# Patient Record
Sex: Female | Born: 1965 | Race: White | Hispanic: No | Marital: Married | Smoking: Former smoker
Health system: Southern US, Community
[De-identification: ages and names within clinical notes are randomized; demographics above are authoritative.]

## PROBLEM LIST (undated history)

## (undated) DIAGNOSIS — D709 Neutropenia, unspecified: Secondary | ICD-10-CM

## (undated) DIAGNOSIS — R0600 Dyspnea, unspecified: Secondary | ICD-10-CM

## (undated) DIAGNOSIS — D72819 Decreased white blood cell count, unspecified: Secondary | ICD-10-CM

## (undated) HISTORY — PX: NO PAST SURGERIES: SHX2092

---

## 2003-06-17 ENCOUNTER — Other Ambulatory Visit: Admission: RE | Admit: 2003-06-17 | Discharge: 2003-06-17 | Payer: Self-pay | Admitting: Obstetrics and Gynecology

## 2011-08-11 ENCOUNTER — Other Ambulatory Visit: Payer: Self-pay | Admitting: Family Medicine

## 2011-08-11 DIAGNOSIS — M545 Low back pain, unspecified: Secondary | ICD-10-CM

## 2011-08-13 ENCOUNTER — Ambulatory Visit
Admission: RE | Admit: 2011-08-13 | Discharge: 2011-08-13 | Disposition: A | Discharge status: Home / Self Care | Payer: 59 | Source: Ambulatory Visit | Attending: Family Medicine | Admitting: Family Medicine

## 2011-08-13 DIAGNOSIS — M545 Low back pain, unspecified: Secondary | ICD-10-CM

## 2011-08-14 ENCOUNTER — Other Ambulatory Visit: Payer: Self-pay

## 2011-08-15 ENCOUNTER — Other Ambulatory Visit: Payer: Self-pay | Admitting: Family Medicine

## 2011-08-15 DIAGNOSIS — R209 Unspecified disturbances of skin sensation: Secondary | ICD-10-CM

## 2011-08-16 ENCOUNTER — Ambulatory Visit
Admission: RE | Admit: 2011-08-16 | Discharge: 2011-08-16 | Disposition: A | Discharge status: Home / Self Care | Payer: 59 | Source: Ambulatory Visit | Attending: Family Medicine | Admitting: Family Medicine

## 2011-08-16 DIAGNOSIS — R209 Unspecified disturbances of skin sensation: Secondary | ICD-10-CM

## 2012-05-08 ENCOUNTER — Other Ambulatory Visit (HOSPITAL_COMMUNITY)
Admission: RE | Admit: 2012-05-08 | Discharge: 2012-05-08 | Disposition: A | Discharge status: Home / Self Care | Payer: 59 | Source: Ambulatory Visit | Attending: Family Medicine | Admitting: Family Medicine

## 2012-05-08 ENCOUNTER — Other Ambulatory Visit: Payer: Self-pay | Admitting: Family Medicine

## 2012-05-08 DIAGNOSIS — Z1151 Encounter for screening for human papillomavirus (HPV): Secondary | ICD-10-CM | POA: Insufficient documentation

## 2012-05-08 DIAGNOSIS — Z124 Encounter for screening for malignant neoplasm of cervix: Secondary | ICD-10-CM | POA: Insufficient documentation

## 2012-10-16 ENCOUNTER — Other Ambulatory Visit: Payer: Self-pay | Admitting: Family Medicine

## 2012-10-16 DIAGNOSIS — M549 Dorsalgia, unspecified: Secondary | ICD-10-CM

## 2012-11-28 ENCOUNTER — Other Ambulatory Visit: Payer: Self-pay

## 2012-11-28 DIAGNOSIS — Z1231 Encounter for screening mammogram for malignant neoplasm of breast: Secondary | ICD-10-CM

## 2012-12-09 ENCOUNTER — Ambulatory Visit
Admission: RE | Admit: 2012-12-09 | Discharge: 2012-12-09 | Disposition: A | Discharge status: Home / Self Care | Payer: 59 | Source: Ambulatory Visit

## 2012-12-09 DIAGNOSIS — Z1231 Encounter for screening mammogram for malignant neoplasm of breast: Secondary | ICD-10-CM

## 2015-02-17 ENCOUNTER — Other Ambulatory Visit: Payer: Self-pay

## 2015-02-17 DIAGNOSIS — Z1231 Encounter for screening mammogram for malignant neoplasm of breast: Secondary | ICD-10-CM

## 2015-02-19 ENCOUNTER — Ambulatory Visit: Payer: Self-pay

## 2015-03-18 ENCOUNTER — Ambulatory Visit
Admission: RE | Admit: 2015-03-18 | Discharge: 2015-03-18 | Disposition: A | Discharge status: Home / Self Care | Payer: 59 | Source: Ambulatory Visit

## 2015-03-18 DIAGNOSIS — Z1231 Encounter for screening mammogram for malignant neoplasm of breast: Secondary | ICD-10-CM

## 2016-01-25 DIAGNOSIS — M5136 Other intervertebral disc degeneration, lumbar region: Secondary | ICD-10-CM | POA: Diagnosis not present

## 2016-01-25 DIAGNOSIS — M5126 Other intervertebral disc displacement, lumbar region: Secondary | ICD-10-CM | POA: Diagnosis not present

## 2016-01-25 DIAGNOSIS — M5410 Radiculopathy, site unspecified: Secondary | ICD-10-CM | POA: Diagnosis not present

## 2016-05-29 DIAGNOSIS — L718 Other rosacea: Secondary | ICD-10-CM | POA: Diagnosis not present

## 2016-05-29 DIAGNOSIS — C44519 Basal cell carcinoma of skin of other part of trunk: Secondary | ICD-10-CM | POA: Diagnosis not present

## 2016-05-29 DIAGNOSIS — L218 Other seborrheic dermatitis: Secondary | ICD-10-CM | POA: Diagnosis not present

## 2016-06-26 DIAGNOSIS — Z08 Encounter for follow-up examination after completed treatment for malignant neoplasm: Secondary | ICD-10-CM | POA: Diagnosis not present

## 2016-06-26 DIAGNOSIS — Z85828 Personal history of other malignant neoplasm of skin: Secondary | ICD-10-CM | POA: Diagnosis not present

## 2016-10-27 DIAGNOSIS — Z23 Encounter for immunization: Secondary | ICD-10-CM | POA: Diagnosis not present

## 2016-11-13 ENCOUNTER — Other Ambulatory Visit: Payer: Self-pay | Admitting: Family Medicine

## 2016-11-13 DIAGNOSIS — J309 Allergic rhinitis, unspecified: Secondary | ICD-10-CM | POA: Diagnosis not present

## 2016-11-13 DIAGNOSIS — Z0000 Encounter for general adult medical examination without abnormal findings: Secondary | ICD-10-CM | POA: Diagnosis not present

## 2016-11-13 DIAGNOSIS — G43109 Migraine with aura, not intractable, without status migrainosus: Secondary | ICD-10-CM | POA: Diagnosis not present

## 2016-11-13 DIAGNOSIS — Z1231 Encounter for screening mammogram for malignant neoplasm of breast: Secondary | ICD-10-CM

## 2016-11-13 DIAGNOSIS — R238 Other skin changes: Secondary | ICD-10-CM | POA: Diagnosis not present

## 2016-11-30 ENCOUNTER — Ambulatory Visit
Admission: RE | Admit: 2016-11-30 | Discharge: 2016-11-30 | Disposition: A | Discharge status: Home / Self Care | Payer: 59 | Source: Ambulatory Visit | Attending: Family Medicine | Admitting: Family Medicine

## 2016-11-30 DIAGNOSIS — Z1231 Encounter for screening mammogram for malignant neoplasm of breast: Secondary | ICD-10-CM | POA: Diagnosis not present

## 2016-12-28 DIAGNOSIS — M3500 Sicca syndrome, unspecified: Secondary | ICD-10-CM | POA: Diagnosis not present

## 2016-12-28 DIAGNOSIS — M47816 Spondylosis without myelopathy or radiculopathy, lumbar region: Secondary | ICD-10-CM | POA: Diagnosis not present

## 2017-01-24 DIAGNOSIS — R062 Wheezing: Secondary | ICD-10-CM | POA: Diagnosis not present

## 2017-01-24 DIAGNOSIS — J0100 Acute maxillary sinusitis, unspecified: Secondary | ICD-10-CM | POA: Diagnosis not present

## 2017-01-24 DIAGNOSIS — R05 Cough: Secondary | ICD-10-CM | POA: Diagnosis not present

## 2017-06-19 DIAGNOSIS — J0140 Acute pansinusitis, unspecified: Secondary | ICD-10-CM | POA: Diagnosis not present

## 2017-06-19 DIAGNOSIS — R509 Fever, unspecified: Secondary | ICD-10-CM | POA: Diagnosis not present

## 2017-06-19 DIAGNOSIS — R52 Pain, unspecified: Secondary | ICD-10-CM | POA: Diagnosis not present

## 2017-11-09 DIAGNOSIS — Z23 Encounter for immunization: Secondary | ICD-10-CM | POA: Diagnosis not present

## 2017-11-26 ENCOUNTER — Other Ambulatory Visit: Payer: Self-pay | Admitting: Family Medicine

## 2017-11-26 ENCOUNTER — Other Ambulatory Visit (HOSPITAL_COMMUNITY)
Admission: RE | Admit: 2017-11-26 | Discharge: 2017-11-26 | Disposition: A | Discharge status: Home / Self Care | Payer: 59 | Source: Ambulatory Visit | Attending: Family Medicine | Admitting: Family Medicine

## 2017-11-26 DIAGNOSIS — Z124 Encounter for screening for malignant neoplasm of cervix: Secondary | ICD-10-CM | POA: Diagnosis present

## 2017-11-26 DIAGNOSIS — G43109 Migraine with aura, not intractable, without status migrainosus: Secondary | ICD-10-CM | POA: Diagnosis not present

## 2017-11-26 DIAGNOSIS — R300 Dysuria: Secondary | ICD-10-CM | POA: Diagnosis not present

## 2017-11-26 DIAGNOSIS — Z0000 Encounter for general adult medical examination without abnormal findings: Secondary | ICD-10-CM | POA: Diagnosis not present

## 2017-11-26 DIAGNOSIS — J309 Allergic rhinitis, unspecified: Secondary | ICD-10-CM | POA: Diagnosis not present

## 2017-11-26 DIAGNOSIS — Z13220 Encounter for screening for lipoid disorders: Secondary | ICD-10-CM | POA: Diagnosis not present

## 2017-11-27 ENCOUNTER — Other Ambulatory Visit: Payer: Self-pay | Admitting: Family Medicine

## 2017-11-27 DIAGNOSIS — Z780 Asymptomatic menopausal state: Secondary | ICD-10-CM

## 2017-11-27 DIAGNOSIS — E2839 Other primary ovarian failure: Secondary | ICD-10-CM

## 2017-11-28 LAB — CYTOLOGY - PAP
DIAGNOSIS: NEGATIVE
HPV: NOT DETECTED

## 2017-12-07 ENCOUNTER — Other Ambulatory Visit: Payer: Self-pay | Admitting: Family Medicine

## 2017-12-07 DIAGNOSIS — Z1231 Encounter for screening mammogram for malignant neoplasm of breast: Secondary | ICD-10-CM

## 2018-01-22 ENCOUNTER — Other Ambulatory Visit: Payer: 59

## 2018-01-22 ENCOUNTER — Ambulatory Visit: Payer: 59

## 2018-02-12 ENCOUNTER — Ambulatory Visit
Admission: RE | Admit: 2018-02-12 | Discharge: 2018-02-12 | Disposition: A | Discharge status: Home / Self Care | Payer: 59 | Source: Ambulatory Visit | Attending: Family Medicine | Admitting: Family Medicine

## 2018-02-12 DIAGNOSIS — Z13820 Encounter for screening for osteoporosis: Secondary | ICD-10-CM | POA: Diagnosis not present

## 2018-02-12 DIAGNOSIS — Z1231 Encounter for screening mammogram for malignant neoplasm of breast: Secondary | ICD-10-CM | POA: Diagnosis not present

## 2018-02-12 DIAGNOSIS — Z780 Asymptomatic menopausal state: Secondary | ICD-10-CM

## 2018-02-12 DIAGNOSIS — E2839 Other primary ovarian failure: Secondary | ICD-10-CM

## 2018-06-28 IMAGING — MG 2D DIGITAL SCREENING BILATERAL MAMMOGRAM WITH CAD AND ADJUNCT TO
8 of 12 series · 8 of 28 positions shown · non-contrast
Comparison: Previous exam(s).

CLINICAL DATA: Screening.

EXAM:
2D DIGITAL SCREENING BILATERAL MAMMOGRAM WITH CAD AND ADJUNCT TOMO

[L CC synth-2D]
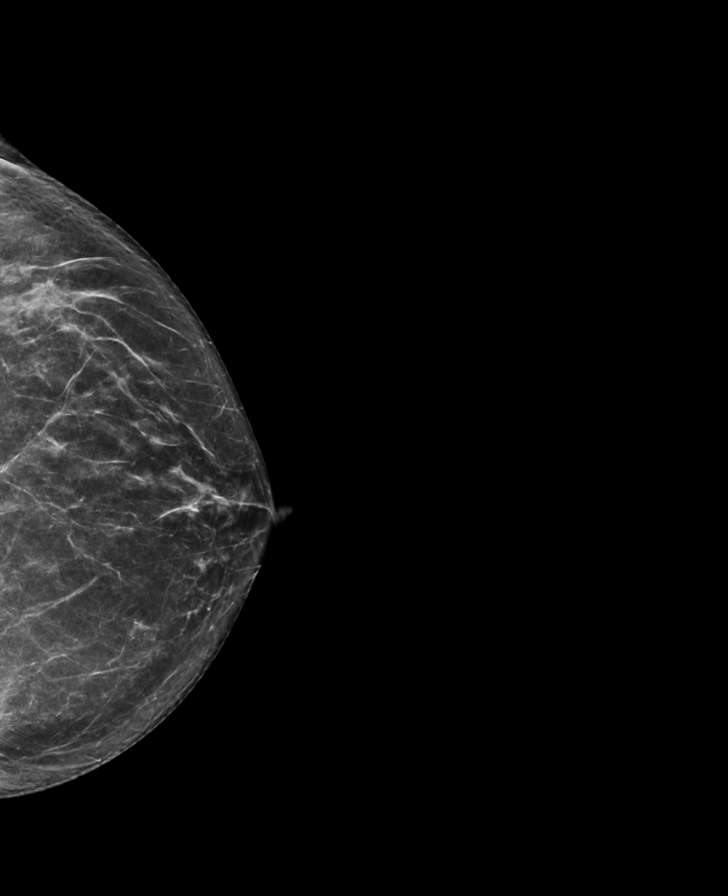

[R MLO synth-2D]
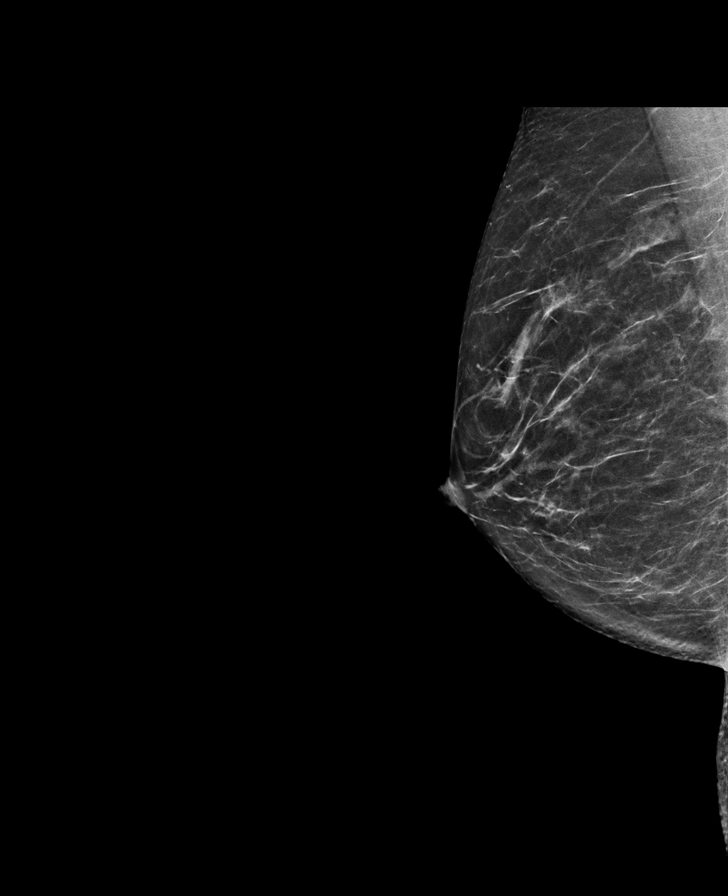

[R CC synth-2D]
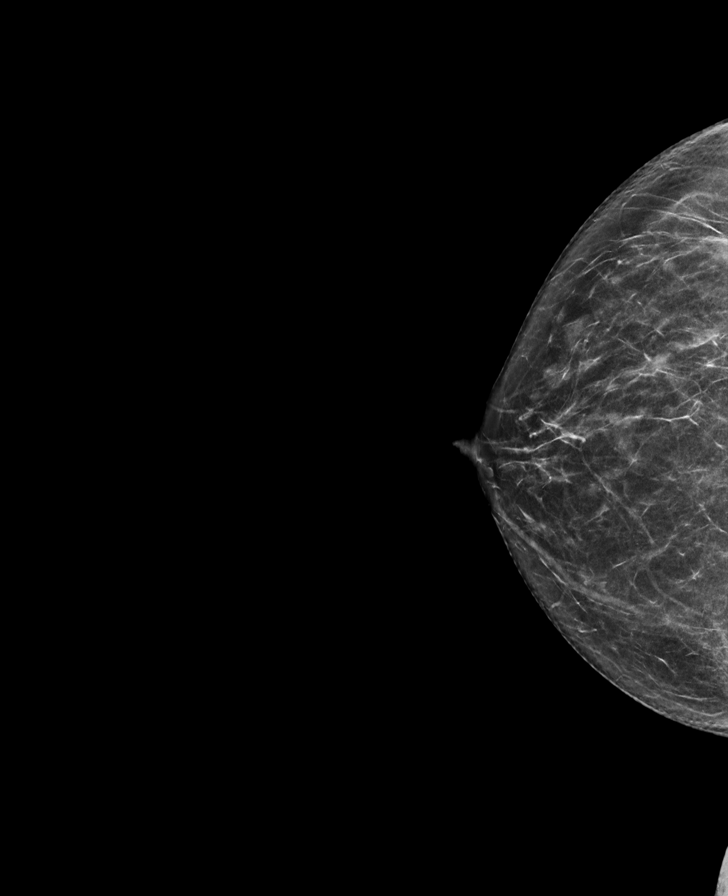

[L CC]
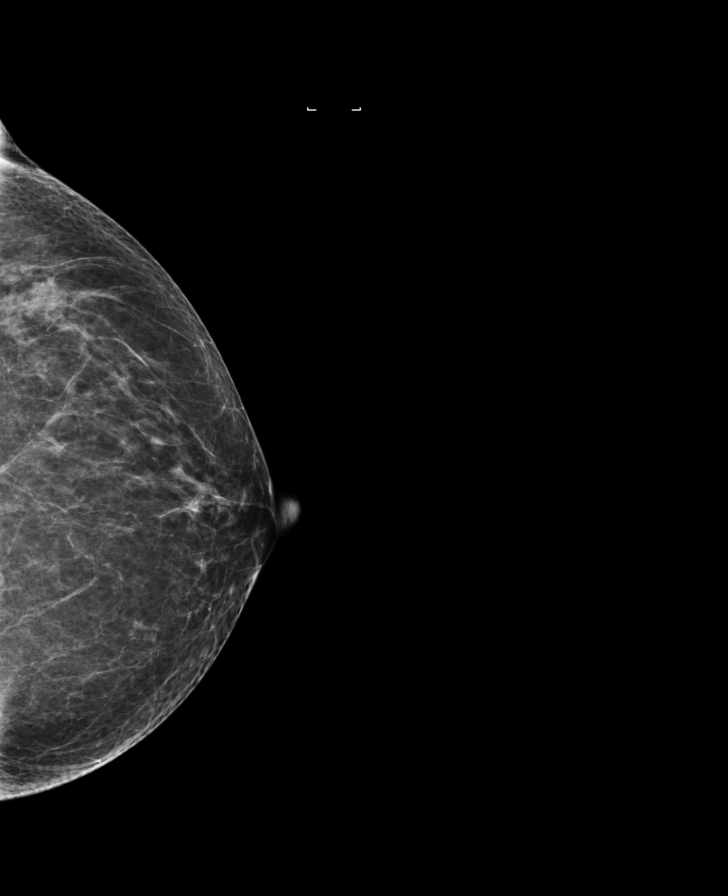

[L MLO]
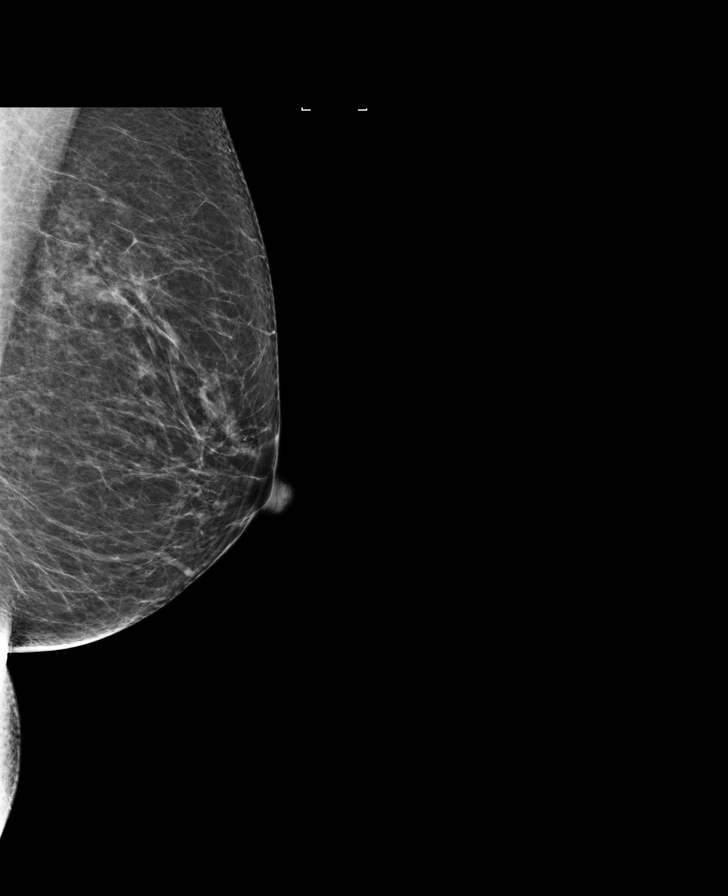

[R CC]
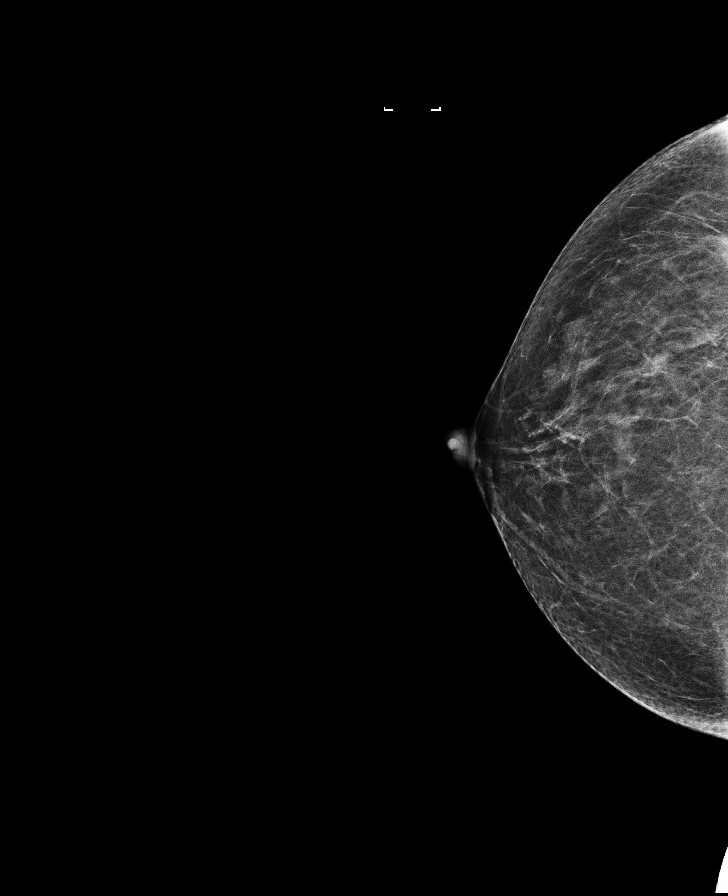

[L MLO synth-2D]
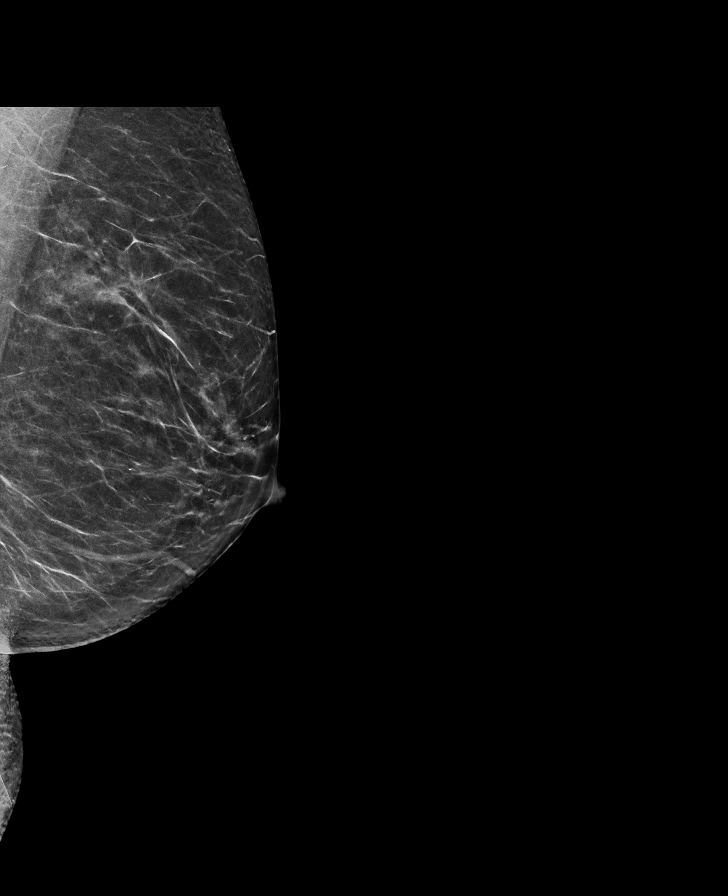

[R MLO]
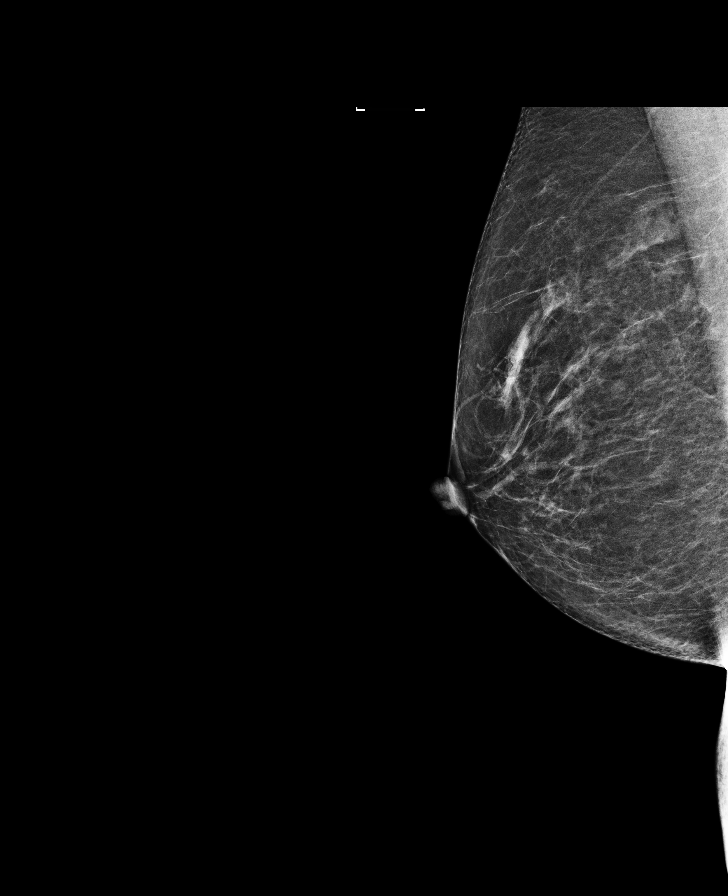

[8 of 28 positions shown; findings below may reference images not displayed]

ACR Breast Density Category b: There are scattered areas of
fibroglandular density.
FINDINGS: There are no findings suspicious for malignancy. Images were
processed with CAD.
IMPRESSION: No mammographic evidence of malignancy. A result letter of this
screening mammogram will be mailed directly to the patient.

RECOMMENDATION:
Screening mammogram in one year. (Code:97-6-RS4)

BI-RADS CATEGORY  1: Negative.

## 2020-04-23 ENCOUNTER — Other Ambulatory Visit: Payer: Self-pay | Admitting: Family Medicine

## 2020-04-23 DIAGNOSIS — Z1231 Encounter for screening mammogram for malignant neoplasm of breast: Secondary | ICD-10-CM

## 2020-04-29 DIAGNOSIS — M5416 Radiculopathy, lumbar region: Secondary | ICD-10-CM | POA: Diagnosis not present

## 2020-05-25 DIAGNOSIS — M5416 Radiculopathy, lumbar region: Secondary | ICD-10-CM | POA: Diagnosis not present

## 2020-06-08 DIAGNOSIS — M5416 Radiculopathy, lumbar region: Secondary | ICD-10-CM | POA: Diagnosis not present

## 2020-06-08 DIAGNOSIS — M5412 Radiculopathy, cervical region: Secondary | ICD-10-CM | POA: Diagnosis not present

## 2020-06-14 ENCOUNTER — Ambulatory Visit
Admission: RE | Admit: 2020-06-14 | Discharge: 2020-06-14 | Disposition: A | Discharge status: Home / Self Care | Payer: 59 | Source: Ambulatory Visit | Attending: Family Medicine | Admitting: Family Medicine

## 2020-06-14 ENCOUNTER — Other Ambulatory Visit: Payer: Self-pay

## 2020-06-14 DIAGNOSIS — Z1231 Encounter for screening mammogram for malignant neoplasm of breast: Secondary | ICD-10-CM

## 2020-07-16 DIAGNOSIS — R5383 Other fatigue: Secondary | ICD-10-CM | POA: Diagnosis not present

## 2020-07-16 DIAGNOSIS — M2550 Pain in unspecified joint: Secondary | ICD-10-CM | POA: Diagnosis not present

## 2020-07-16 DIAGNOSIS — M3500 Sicca syndrome, unspecified: Secondary | ICD-10-CM | POA: Diagnosis not present

## 2020-07-16 DIAGNOSIS — M150 Primary generalized (osteo)arthritis: Secondary | ICD-10-CM | POA: Diagnosis not present

## 2020-07-16 DIAGNOSIS — Z1589 Genetic susceptibility to other disease: Secondary | ICD-10-CM | POA: Diagnosis not present

## 2020-07-16 DIAGNOSIS — M064 Inflammatory polyarthropathy: Secondary | ICD-10-CM | POA: Diagnosis not present

## 2020-07-22 DIAGNOSIS — H18513 Endothelial corneal dystrophy, bilateral: Secondary | ICD-10-CM | POA: Diagnosis not present

## 2020-07-22 DIAGNOSIS — H00011 Hordeolum externum right upper eyelid: Secondary | ICD-10-CM | POA: Diagnosis not present

## 2020-08-11 DIAGNOSIS — H00011 Hordeolum externum right upper eyelid: Secondary | ICD-10-CM | POA: Diagnosis not present

## 2020-08-25 DIAGNOSIS — Z23 Encounter for immunization: Secondary | ICD-10-CM | POA: Diagnosis not present

## 2020-08-25 DIAGNOSIS — J309 Allergic rhinitis, unspecified: Secondary | ICD-10-CM | POA: Diagnosis not present

## 2020-08-25 DIAGNOSIS — B009 Herpesviral infection, unspecified: Secondary | ICD-10-CM | POA: Diagnosis not present

## 2020-08-25 DIAGNOSIS — Z13220 Encounter for screening for lipoid disorders: Secondary | ICD-10-CM | POA: Diagnosis not present

## 2020-08-25 DIAGNOSIS — Z0000 Encounter for general adult medical examination without abnormal findings: Secondary | ICD-10-CM | POA: Diagnosis not present

## 2020-08-25 DIAGNOSIS — R0989 Other specified symptoms and signs involving the circulatory and respiratory systems: Secondary | ICD-10-CM | POA: Diagnosis not present

## 2020-08-25 DIAGNOSIS — G43109 Migraine with aura, not intractable, without status migrainosus: Secondary | ICD-10-CM | POA: Diagnosis not present

## 2020-08-26 DIAGNOSIS — Z1589 Genetic susceptibility to other disease: Secondary | ICD-10-CM | POA: Diagnosis not present

## 2020-08-26 DIAGNOSIS — M150 Primary generalized (osteo)arthritis: Secondary | ICD-10-CM | POA: Diagnosis not present

## 2020-08-26 DIAGNOSIS — M2550 Pain in unspecified joint: Secondary | ICD-10-CM | POA: Diagnosis not present

## 2020-08-26 DIAGNOSIS — M3500 Sicca syndrome, unspecified: Secondary | ICD-10-CM | POA: Diagnosis not present

## 2020-11-23 DIAGNOSIS — Z23 Encounter for immunization: Secondary | ICD-10-CM | POA: Diagnosis not present

## 2020-12-21 DIAGNOSIS — R002 Palpitations: Secondary | ICD-10-CM | POA: Diagnosis not present

## 2020-12-22 DIAGNOSIS — M67432 Ganglion, left wrist: Secondary | ICD-10-CM | POA: Diagnosis not present

## 2021-02-23 DIAGNOSIS — M150 Primary generalized (osteo)arthritis: Secondary | ICD-10-CM | POA: Diagnosis not present

## 2021-02-23 DIAGNOSIS — M3500 Sicca syndrome, unspecified: Secondary | ICD-10-CM | POA: Diagnosis not present

## 2021-02-23 DIAGNOSIS — M2550 Pain in unspecified joint: Secondary | ICD-10-CM | POA: Diagnosis not present

## 2021-02-23 DIAGNOSIS — Z1589 Genetic susceptibility to other disease: Secondary | ICD-10-CM | POA: Diagnosis not present

## 2021-03-02 DIAGNOSIS — J0140 Acute pansinusitis, unspecified: Secondary | ICD-10-CM | POA: Diagnosis not present

## 2021-03-02 DIAGNOSIS — R0981 Nasal congestion: Secondary | ICD-10-CM | POA: Diagnosis not present

## 2021-05-25 DIAGNOSIS — L259 Unspecified contact dermatitis, unspecified cause: Secondary | ICD-10-CM | POA: Diagnosis not present

## 2021-07-09 DIAGNOSIS — R3915 Urgency of urination: Secondary | ICD-10-CM | POA: Diagnosis not present

## 2021-07-09 DIAGNOSIS — N390 Urinary tract infection, site not specified: Secondary | ICD-10-CM | POA: Diagnosis not present

## 2021-09-06 ENCOUNTER — Other Ambulatory Visit: Payer: BC Managed Care – PPO

## 2021-09-06 ENCOUNTER — Other Ambulatory Visit: Payer: Self-pay | Admitting: Family Medicine

## 2021-09-06 DIAGNOSIS — N6459 Other signs and symptoms in breast: Secondary | ICD-10-CM

## 2021-10-07 ENCOUNTER — Other Ambulatory Visit: Payer: BC Managed Care – PPO

## 2021-11-10 ENCOUNTER — Ambulatory Visit
Admission: RE | Admit: 2021-11-10 | Discharge: 2021-11-10 | Disposition: A | Discharge status: Home / Self Care | Payer: No Typology Code available for payment source | Source: Ambulatory Visit | Attending: Family Medicine | Admitting: Family Medicine

## 2021-11-10 DIAGNOSIS — N6459 Other signs and symptoms in breast: Secondary | ICD-10-CM

## 2022-08-29 ENCOUNTER — Ambulatory Visit: Payer: No Typology Code available for payment source | Attending: Cardiology | Admitting: Cardiology

## 2022-08-29 ENCOUNTER — Encounter: Payer: Self-pay | Admitting: Cardiology

## 2022-08-29 VITALS — BP 112/82 | HR 82 | Ht 66.0 in | Wt 156.2 lb

## 2022-08-29 DIAGNOSIS — I491 Atrial premature depolarization: Secondary | ICD-10-CM

## 2022-08-29 DIAGNOSIS — Z8249 Family history of ischemic heart disease and other diseases of the circulatory system: Secondary | ICD-10-CM

## 2022-08-29 DIAGNOSIS — R002 Palpitations: Secondary | ICD-10-CM | POA: Diagnosis not present

## 2022-08-29 NOTE — Patient Instructions (Signed)
Medication Instructions:  The current medical regimen is effective;  continue present plan and medications.  *If you need a refill on your cardiac medications before your next appointment, please call your pharmacy*  Testing/Procedures: Your physician has requested that you have a Coronary Calcium score which is completed by CT. Cardiac computed tomography (CT) is a painless test that uses an x-ray machine to take clear, detailed pictures of your heart. There are no instructions for this testing.  You may eat/drink and take your normal medications this day.  The cost of the testing is $99 due at the time of your appointment.  Follow-Up: At [facility], you and your health needs are our priority.  As part of our continuing mission to provide you with exceptional heart care, we have created designated Provider Care Teams.  These Care Teams include your primary Cardiologist (physician) and Advanced Practice Providers (APPs -  Physician Assistants and Nurse Practitioners) who all work together to provide you with the care you need, when you need it.  We recommend signing up for the patient portal called "MyChart".  Sign up information is provided on this After Visit Summary.  MyChart is used to connect with patients for Virtual Visits (Telemedicine).  Patients are able to view lab/test results, encounter notes, upcoming appointments, etc.  Non-urgent messages can be sent to your provider as well.   To learn more about what you can do with MyChart, go to [email or web address].    Your next appointment:   Follow up will be based on the results of the above testing.

## 2022-08-29 NOTE — Progress Notes (Signed)
Cardiology Office Note:    Date:  08/29/2022   ID:  Joanna Mullins, DOB 29-Dec-1965, MRN [id number]  PCP:  Sigmund Hazel, MD   [facility] Providers Cardiologist:  Donato Schultz, MD     Referring MD: Sigmund Hazel, MD    History of Present Illness:    Joanna Mullins is a 57 y.o. female here for the evaluation of palpitations at the request of Dr. Sigmund Hazel.  She brought with her Apple Watch transmissions which shows sinus rhythm with PACs heart rate in the 74 bpm range.  One year ago palps with antihistamine. Stopped it. Propranolol increased and helped for a while but now back.  Takes propranolol for social anxiety.  No chest pain no shortness of breath.  No syncope.  No alcohol use.  Her daughter had a VSD, valvular repair, congenital.  She works for [employer]    History reviewed. No pertinent past medical history.  History reviewed. No pertinent surgical history.  Current Medications: Current Meds  Medication Sig   azelastine (ASTELIN) 0.1 % nasal spray Place 2 sprays into both nostrils as needed for rhinitis. Use in each nostril as directed   fluticasone (FLONASE) 50 MCG/ACT nasal spray Place 1 spray into both nostrils as needed for allergies or rhinitis.   meloxicam (MOBIC) 15 MG tablet Take 15 mg by mouth daily.   pilocarpine (SALAGEN) 5 MG tablet Take 5 mg by mouth daily.   propranolol (INDERAL) 20 MG tablet Take 20 mg by mouth daily.   SUMAtriptan (IMITREX) 100 MG tablet Take 100 mg by mouth as needed for migraine. May repeat in 2 hours if headache persists or recurs.     Allergies:   Patient has no allergy information on record.   Social History   Socioeconomic History   Marital status: Married    Spouse name: Not on file   Number of children: Not on file   Years of education: Not on file   Highest education level: Not on file  Occupational History   Not on file  Tobacco Use   Smoking status: Former    Types: Cigarettes   Smokeless  tobacco: Former  Substance and Sexual Activity   Alcohol use: Yes    Alcohol/week: 1.0 standard drink of alcohol    Types: 1 Glasses of wine per week   Drug use: Never   Sexual activity: Not on file  Other Topics Concern   Not on file  Social History Narrative   Not on file   Social Determinants of Health   Financial Resource Strain: Not on file  Food Insecurity: Not on file  Transportation Needs: Not on file  Physical Activity: Not on file  Stress: Not on file  Social Connections: Unknown (06/06/2021)   Received from [facility]   Social Network    Social Network: Not on file     Family History: The patient's family history includes Congestive Heart Failure in her father; Mitral valve prolapse in her father. There is no history of Breast cancer.  Father has heart disease, valvular issues  ROS:   Please see the history of present illness.     All other systems reviewed and are negative.  EKGs/Labs/Other Studies Reviewed:    The following studies were reviewed today: Several Apple watch tracings reviewed  EKG:  The ekg ordered today demonstrates EKG Interpretation Date/Time:  Tuesday August 29 2022 09:50:05 EDT Ventricular Rate:  66 PR Interval:  144 QRS Duration:  78 QT Interval:  392 QTC Calculation: 410 R Axis:   70  Text Interpretation: Normal sinus rhythm Normal ECG No previous ECGs available Confirmed by Donato Schultz ([id number]) on 08/29/2022 9:51:59 AM    Recent Labs: No results found for requested labs within last 365 days.  Recent Lipid Panel No results found for: "CHOL", "TRIG", "HDL", "CHOLHDL", "VLDL", "LDLCALC", "LDLDIRECT"   Risk Assessment/Calculations:               Physical Exam:    VS:  BP 112/82   Pulse 82   Ht 5\' 6"  (1.676 m)   Wt 156 lb 3.2 oz (70.9 kg)   SpO2 97%   BMI 25.21 kg/m     Wt Readings from Last 3 Encounters:  08/29/22 156 lb 3.2 oz (70.9 kg)     GEN:  Well nourished, well developed in no acute  distress HEENT: Normal NECK: No JVD; No carotid bruits LYMPHATICS: No lymphadenopathy CARDIAC: RRR, no murmurs, rubs, gallops RESPIRATORY:  Clear to auscultation without rales, wheezing or rhonchi  ABDOMEN: Soft, non-tender, non-distended MUSCULOSKELETAL:  No edema; No deformity  SKIN: Warm and dry NEUROLOGIC:  Alert and oriented x 3 PSYCHIATRIC:  Normal affect   ASSESSMENT:    1. Palpitation   2. Family history of cardiovascular disease   3. Premature atrial contractions    PLAN:    In order of problems listed above:  Palpitations PACs -PACs noted on Apple Watch tracing.  EKG today benign.  Normal intervals.  Reassurance.  Could trial magnesium supplementation.  PACs can often be periodic or cyclical.  Described to her the physiology behind them and it does correlate with her symptoms.  I do not appreciate any heart murmurs.  Her daughter does have a congenital heart defect.  She could also take an additional propranolol if necessary.  She knows to reach out if symptoms worsen or become more worrisome.  No high risk symptoms such as syncope.  Family history of heart disease - I will check a coronary calcium score, $99 test.  Prior LDL 119.  If evidence of coronary plaque is present would consider utilization of Crestor for instance.  We will follow-up with results of study.  Otherwise as needed follow-up.         Medication Adjustments/Labs and Tests Ordered: Current medicines are reviewed at length with the patient today.  Concerns regarding medicines are outlined above.  Orders Placed This Encounter  Procedures   CT CARDIAC SCORING (SELF PAY ONLY)   EKG 12-Lead   No orders of the defined types were placed in this encounter.   Patient Instructions  Medication Instructions:  The current medical regimen is effective;  continue present plan and medications.  *If you need a refill on your cardiac medications before your next appointment, please call your  pharmacy*  Testing/Procedures: Your physician has requested that you have a Coronary Calcium score which is completed by CT. Cardiac computed tomography (CT) is a painless test that uses an x-ray machine to take clear, detailed pictures of your heart. There are no instructions for this testing.  You may eat/drink and take your normal medications this day.  The cost of the testing is $99 due at the time of your appointment.   Follow-Up: At [facility], you and your health needs are our priority.  As part of our continuing mission to provide you with exceptional heart care, we have created designated Provider Care Teams.  These Care Teams include your primary  Cardiologist (physician) and Advanced Practice Providers (APPs -  Physician Assistants and Nurse Practitioners) who all work together to provide you with the care you need, when you need it.  We recommend signing up for the patient portal called "MyChart".  Sign up information is provided on this After Visit Summary.  MyChart is used to connect with patients for Virtual Visits (Telemedicine).  Patients are able to view lab/test results, encounter notes, upcoming appointments, etc.  Non-urgent messages can be sent to your provider as well.   To learn more about what you can do with MyChart, go to [email or web address].    Your next appointment:   Follow up will be based on the results of the above testing.   Signed, Donato Schultz, MD  08/29/2022 10:19 AM    [facility]

## 2022-08-31 ENCOUNTER — Ambulatory Visit (HOSPITAL_BASED_OUTPATIENT_CLINIC_OR_DEPARTMENT_OTHER)
Admission: RE | Admit: 2022-08-31 | Discharge: 2022-08-31 | Disposition: A | Discharge status: Home / Self Care | Payer: No Typology Code available for payment source | Source: Ambulatory Visit | Attending: Cardiology | Admitting: Cardiology

## 2022-08-31 DIAGNOSIS — R002 Palpitations: Secondary | ICD-10-CM | POA: Insufficient documentation

## 2022-08-31 DIAGNOSIS — Z8249 Family history of ischemic heart disease and other diseases of the circulatory system: Secondary | ICD-10-CM | POA: Insufficient documentation

## 2023-10-21 NOTE — Progress Notes (Unsigned)
 [facility] [phone]   [phone]) [phone]  CONSULT NOTE  REFERRING PHYSICIAN:  REASON FOR CONSULTATION:  Leukopenia  HPI Joanna Mullins is a 58 y.o. female with a past medical history significant for Sjogren's syndrome, migraines, and osteoarthritis is referred to the clinic for leukopenia.   She was referred by rheumatology. She had an appointment on 09/28/23 for 6 month follow up for Sjogren Syndrome. She alternates between cevimeline and pilocarpine. She takes meloxicam.   Her labs showed low WBC of 2.0, Hbg of 11.7, elevated MCV of 105, and normal platelet count of 158. Her neutrophils were low at 0.7.   Two other records of CBC but date of collection unclear. WBC 3.5, Hbg 12.6, and platelets 175. ANC 2.0.   ***Known vitamin deficiencies. She does not take any iron or B12 supplements.  Sides vitamin D deficiency, she states to her knowledge she was never told that she had any other vitamin deficiencies.   ***Any other new medicine? Rare side effect of leukopenia pilocarpine. ***how often take meloxicam so can cevimeline   Overall she is feeling well***.  She states that she has *** energy and she exercises on a regular basis.   Regarding the leukopenia,*** was told she had a low white blood cell count.  She rarely gets sick.  She denies any unusual bloating or early satiety.  She does not have any dietary restrictions.  She does not take any ***NSAIDs.   She is not taking any blood thinners.  She has never required a blood transfusion or an iron infusion.  He does not have any chronic kidney disease.  Denies any pica.  She denies blood donation.  **Recent illness. Get sick frequently. History of viruses llike Hep, HIV,  The patient denies any known liver disease such as hepatitis, NAFLD, or cirrhosis. She ***denies any alcohol use or history of heavy alcohol use.     She denies fevers, chills, or unexplained weight loss.    She does not take any  OTC herbal supplements. ***Tea   Denies any history of HIV or hepatitis. Denies any known tick borne illness. Denies lymphadenopathy.        HPI  No past medical history on file.  No past surgical history on file.  Family History  Problem Relation Age of Onset   Congestive Heart Failure Father    Mitral valve prolapse Father    Breast cancer Neg Hx     Social History Social History   Tobacco Use   Smoking status: Former    Types: Cigarettes   Smokeless tobacco: Former  Substance Use Topics   Alcohol use: Yes    Alcohol/week: 1.0 standard drink of alcohol    Types: 1 Glasses of wine per week   Drug use: Never    Not on File  Current Outpatient Medications  Medication Sig Dispense Refill   azelastine (ASTELIN) 0.1 % nasal spray Place 2 sprays into both nostrils as needed for rhinitis. Use in each nostril as directed     fluticasone (FLONASE) 50 MCG/ACT nasal spray Place 1 spray into both nostrils as needed for allergies or rhinitis.     meloxicam (MOBIC) 15 MG tablet Take 15 mg by mouth daily.     pilocarpine (SALAGEN) 5 MG tablet Take 5 mg by mouth daily.     propranolol (INDERAL) 20 MG tablet Take 20 mg by mouth daily.     SUMAtriptan (IMITREX) 100 MG tablet Take 100 mg by  mouth as needed for migraine. May repeat in 2 hours if headache persists or recurs.     No current facility-administered medications for this visit.    REVIEW OF SYSTEMS:   Review of Systems  Constitutional: Negative for appetite change, chills, fatigue, fever and unexpected weight change.  HENT:   Negative for mouth sores, nosebleeds, sore throat and trouble swallowing.   Eyes: Negative for eye problems and icterus.  Respiratory: Negative for cough, hemoptysis, shortness of breath and wheezing.   Cardiovascular: Negative for chest pain and leg swelling.  Gastrointestinal: Negative for abdominal pain, constipation, diarrhea, nausea and vomiting.  Genitourinary: Negative for bladder  incontinence, difficulty urinating, dysuria, frequency and hematuria.   Musculoskeletal: Negative for back pain, gait problem, neck pain and neck stiffness.  Skin: Negative for itching and rash.  Neurological: Negative for dizziness, extremity weakness, gait problem, headaches, light-headedness and seizures.  Hematological: Negative for adenopathy. Does not bruise/bleed easily.  Psychiatric/Behavioral: Negative for confusion, depression and sleep disturbance. The patient is not nervous/anxious.     PHYSICAL EXAMINATION:  There were no vitals taken for this visit.  ECOG PERFORMANCE STATUS: {CHL ONC ECOG [id number]  Physical Exam  Constitutional: Oriented to person, place, and time and well-developed, well-nourished, and in no distress. No distress.  HENT:  Head: Normocephalic and atraumatic.  Mouth/Throat: Oropharynx is clear and moist. No oropharyngeal exudate.  Eyes: Conjunctivae are normal. Right eye exhibits no discharge. Left eye exhibits no discharge. No scleral icterus.  Neck: Normal range of motion. Neck supple.  Cardiovascular: Normal rate, regular rhythm, normal heart sounds and intact distal pulses.   Pulmonary/Chest: Effort normal and breath sounds normal. No respiratory distress. No wheezes. No rales.  Abdominal: Soft. Bowel sounds are normal. Exhibits no distension and no mass. There is no tenderness.  Musculoskeletal: Normal range of motion. Exhibits no edema.  Lymphadenopathy:    No cervical adenopathy.  Neurological: Alert and oriented to person, place, and time. Exhibits normal muscle tone. Gait normal. Coordination normal.  Skin: Skin is warm and dry. No rash noted. Not diaphoretic. No erythema. No pallor.  Psychiatric: Mood, memory and judgment normal.  Vitals reviewed.  LABORATORY DATA: No results found for: WBC, HGB, HCT, MCV, PLT    Chemistry   No results found for: NA, K, CL, CO2, BUN, CREATININE, GLU No results found for:  CALCIUM, ALKPHOS, AST, ALT, BILITOT     RADIOGRAPHIC STUDIES: No results found.  ASSESSMENT: This is a very pleasant 58 year old female with Sjogren's who was referred to the clinic for leukopenia.   The patient was seen with Dr. Sherrod. We are arranging for B12, CBC, CMP, folate, B12, hepatitis, and HIV testing. If negative for etiology of leukopenia, it could be autoimmune or drug induced from ***melxocicam and Cev***.   Dr. Sherrod recommends ***  We will see her back in *** months and recheck her labs.   ***If no clear etiology, then would consider bone marrow biopsy and aspirate to rule out bone marrow disorder given the macrocytosis.     The patient voices understanding of current disease status and treatment options and is in agreement with the current care plan.  All questions were answered. The patient knows to call the clinic with any problems, questions or concerns. We can certainly see the patient much sooner if necessary.  Thank you so much for allowing me to participate in the care of Joanna Mullins. I will continue to follow up the patient with you and assist in  her care.  I spent {CHL ONC TIME VISIT - DTPQU:[id number]} counseling the patient face to face. The total time spent in the appointment was {CHL ONC TIME VISIT - DTPQU:[id number]}.  Disclaimer: This note was dictated with voice recognition software. Similar sounding words can inadvertently be transcribed and may not be corrected upon review.   Midge Momon L Rosevelt Luu October 21, 2023, 9:32 AM

## 2023-10-22 ENCOUNTER — Inpatient Hospital Stay: Attending: Physician Assistant | Admitting: Physician Assistant

## 2023-10-22 ENCOUNTER — Inpatient Hospital Stay

## 2023-10-22 ENCOUNTER — Other Ambulatory Visit: Payer: Self-pay | Admitting: Physician Assistant

## 2023-10-22 VITALS — BP 119/79 | HR 73 | Temp 97.7°F | Resp 16 | Ht 66.0 in | Wt 158.0 lb

## 2023-10-22 DIAGNOSIS — M3500 Sicca syndrome, unspecified: Secondary | ICD-10-CM | POA: Diagnosis not present

## 2023-10-22 DIAGNOSIS — R5383 Other fatigue: Secondary | ICD-10-CM | POA: Insufficient documentation

## 2023-10-22 DIAGNOSIS — D709 Neutropenia, unspecified: Secondary | ICD-10-CM

## 2023-10-22 DIAGNOSIS — Z87891 Personal history of nicotine dependence: Secondary | ICD-10-CM | POA: Insufficient documentation

## 2023-10-22 DIAGNOSIS — D72819 Decreased white blood cell count, unspecified: Secondary | ICD-10-CM | POA: Insufficient documentation

## 2023-10-22 LAB — CMP (CANCER CENTER ONLY)
ALT: 18 U/L (ref 0–44)
AST: 18 U/L (ref 15–41)
Albumin: 4.6 g/dL (ref 3.5–5.0)
Alkaline Phosphatase: 94 U/L (ref 38–126)
Anion gap: 6 (ref 5–15)
BUN: 19 mg/dL (ref 6–20)
CO2: 30 mmol/L (ref 22–32)
Calcium: 9.7 mg/dL (ref 8.9–10.3)
Chloride: 102 mmol/L (ref 98–111)
Creatinine: 0.83 mg/dL (ref 0.44–1.00)
GFR, Estimated: >60 mL/min (ref >60)
Glucose, Bld: 96 mg/dL (ref 70–99)
Potassium: 4.3 mmol/L (ref 3.5–5.1)
Sodium: 138 mmol/L (ref 135–145)
Total Bilirubin: 0.5 mg/dL (ref 0.0–1.2)
Total Protein: 7.6 g/dL (ref 6.5–8.1)

## 2023-10-22 LAB — CBC WITH DIFFERENTIAL (CANCER CENTER ONLY)
Abs Immature Granulocytes: 0 K/uL (ref 0.00–0.07)
Basophils Absolute: 0 K/uL (ref 0.0–0.1)
Basophils Relative: 0 %
Eosinophils Absolute: 0 K/uL (ref 0.0–0.5)
Eosinophils Relative: 2 %
HCT: 34.9 % — ABNORMAL LOW (ref 36.0–46.0)
Hemoglobin: 12.1 g/dL (ref 12.0–15.0)
Immature Granulocytes: 0 %
Lymphocytes Relative: 55 %
Lymphs Abs: 0.9 K/uL (ref 0.7–4.0)
MCH: 34 pg (ref 26.0–34.0)
MCHC: 34.7 g/dL (ref 30.0–36.0)
MCV: 98 fL (ref 80.0–100.0)
Monocytes Absolute: 0.1 K/uL (ref 0.1–1.0)
Monocytes Relative: 4 %
Neutro Abs: 0.6 K/uL — ABNORMAL LOW (ref 1.7–7.7)
Neutrophils Relative %: 39 %
Platelet Count: 155 K/uL (ref 150–400)
RBC: 3.56 MIL/uL — ABNORMAL LOW (ref 3.87–5.11)
RDW: 13.2 % (ref 11.5–15.5)
WBC Count: 1.7 K/uL — ABNORMAL LOW (ref 4.0–10.5)
nRBC: 0 % (ref 0.0–0.2)

## 2023-10-22 LAB — HEPATITIS PANEL, ACUTE
HCV Ab: NONREACTIVE
Hep A IgM: NONREACTIVE
Hep B C IgM: NONREACTIVE
Hepatitis B Surface Ag: NONREACTIVE

## 2023-10-22 LAB — FOLATE: Folate: 11.4 ng/mL (ref >5.9)

## 2023-10-22 LAB — LACTATE DEHYDROGENASE: LDH: 172 U/L (ref 98–192)

## 2023-10-22 LAB — VITAMIN B12: Vitamin B-12: 484 pg/mL (ref 180–914)

## 2023-10-22 LAB — HIV ANTIBODY (ROUTINE TESTING W REFLEX): HIV Screen 4th Generation wRfx: NONREACTIVE

## 2023-10-24 ENCOUNTER — Telehealth: Payer: Self-pay

## 2023-10-24 NOTE — Telephone Encounter (Signed)
 Spoke with patient regarding upcoming appointments and dental procedure. Patient reported scheduled appointment with Cassie, PA, on 11/21/23 and bone marrow biopsy on 11/22/23. Informed patient that her visit with Cassie, PA, can be moved to after the bone marrow biopsy to allow review of results. Appointment rescheduled to 11/28/23 for labs at 0800 and visit with Cassie, PA, at 0830.  Patient inquired about dental procedure. Informed her that the dental office may fax a request specifying the procedure to [phone], and our office will return the form with Dr. Jeannett recommendations. Patient voiced understanding.

## 2023-11-20 ENCOUNTER — Other Ambulatory Visit: Payer: Self-pay | Admitting: Radiology

## 2023-11-20 DIAGNOSIS — D72819 Decreased white blood cell count, unspecified: Secondary | ICD-10-CM

## 2023-11-21 ENCOUNTER — Ambulatory Visit: Admitting: Physician Assistant

## 2023-11-21 ENCOUNTER — Other Ambulatory Visit

## 2023-11-21 NOTE — H&P (Signed)
 Chief Complaint: Leukopenia of uncertain etiology; referred for image guided bone marrow biopsy for further evaluation  Referring Provider(s): Mohamed,M  Supervising Physician:   Patient Status: [facility] OP  History of Present Illness: Joanna Mullins is a 58 y.o. female ex smoker with past medical history significant for Sjogren's syndrome, migraine headaches, osteoarthritis who presents now with leukopenia of uncertain etiology.  She is scheduled today for image guided bone marrow biopsy for further evaluation.  *** Patient is Full Code      Allergies: Food  Medications: Prior to Admission medications   Medication Sig Start Date End Date Taking? Authorizing Provider  azelastine (ASTELIN) 0.1 % nasal spray Place 2 sprays into both nostrils as needed for rhinitis. Use in each nostril as directed    [provider]  cevimeline (EVOXAC) 30 MG capsule Take 30 mg by mouth daily. Alternate with pilocarpine    [provider]  cyclobenzaprine (FLEXERIL) 10 MG tablet Take 10 mg by mouth as needed.    [provider]  fluticasone (FLONASE) 50 MCG/ACT nasal spray Place 1 spray into both nostrils as needed for allergies or rhinitis.    [provider]  meloxicam (MOBIC) 15 MG tablet Take 15 mg by mouth daily.    [provider]  pilocarpine (SALAGEN) 5 MG tablet Take 5 mg by mouth daily.    [provider]  Probiotic CHEW Chew by mouth 2 (two) times daily. Seed DS-01    [provider]  propranolol (INDERAL) 20 MG tablet Take 20 mg by mouth daily.    [provider]  SUMAtriptan (IMITREX) 100 MG tablet Take 100 mg by mouth as needed for migraine. May repeat in 2 hours if headache persists or recurs.    [provider]     Family History  Problem Relation Age of Onset   Congestive Heart Failure Father    Mitral valve prolapse Father    Breast cancer Neg Hx     Social History   Socioeconomic History    Marital status: Married    Spouse name: Not on file   Number of children: Not on file   Years of education: Not on file   Highest education level: Not on file  Occupational History   Not on file  Tobacco Use   Smoking status: Former    Types: Cigarettes   Smokeless tobacco: Former  Substance and Sexual Activity   Alcohol use: Yes    Alcohol/week: 1.0 standard drink of alcohol    Types: 1 Glasses of wine per week   Drug use: Never   Sexual activity: Not on file  Other Topics Concern   Not on file  Social History Narrative   Not on file   Social Drivers of Health   Financial Resource Strain: Not on file  Food Insecurity: No Food Insecurity (10/22/2023)   Hunger Vital Sign    Worried About Running Out of Food in the Last Year: Never true    Ran Out of Food in the Last Year: Never true  Transportation Needs: No Transportation Needs (10/22/2023)   PRAPARE - Administrator, Civil Service (Medical): No    Lack of Transportation (Non-Medical): No  Physical Activity: Not on file  Stress: Not on file  Social Connections: Unknown (06/06/2021)   [facility] from [facility]   Social Network    Social Network: Not on file       Review of Systems  Vital Signs:  Advance Care Plan: No documents on file  Physical Exam  Imaging: No results found.  Labs:  CBC: Recent Labs    10/22/23 0849  WBC 1.7*  HGB 12.1  HCT 34.9*  PLT 155    COAGS: No results for input(s): INR, APTT in the last 8760 hours.  BMP: Recent Labs    10/22/23 0849  NA 138  K 4.3  CL 102  CO2 30  GLUCOSE 96  BUN 19  CALCIUM 9.7  CREATININE 0.83  GFRNONAA >60    LIVER FUNCTION TESTS: Recent Labs    10/22/23 0849  BILITOT 0.5  AST 18  ALT 18  ALKPHOS 94  PROT 7.6  ALBUMIN 4.6    TUMOR MARKERS: No results for input(s): AFPTM, CEA, CA199, CHROMGRNA in the last 8760 hours.  Assessment and Plan: 58 y.o. female ex smoker with past medical history  significant for Sjogren's syndrome, migraine headaches, osteoarthritis who presents now with leukopenia of uncertain etiology.  She is scheduled today for image guided bone marrow biopsy for further evaluation.Risks and benefits of procedure was discussed with the patient including, but not limited to bleeding, infection, damage to adjacent structures or low yield requiring additional tests.  All of the questions were answered and there is agreement to proceed.  Consent signed and in chart.    Thank you for allowing our service to participate in ENDYA AUSTIN 's care.  Electronically Signed: D. Franky Rakers, PA-C   11/21/2023, 12:29 PM      I spent a total of  20 minutes   in face to face in clinical consultation, greater than 50% of which was counseling/coordinating care for image guided bone marrow biopsy

## 2023-11-22 ENCOUNTER — Ambulatory Visit (HOSPITAL_COMMUNITY)
Admission: RE | Admit: 2023-11-22 | Discharge: 2023-11-22 | Disposition: A | Discharge status: Home / Self Care | Source: Ambulatory Visit | Attending: Physician Assistant | Admitting: Physician Assistant

## 2023-11-22 ENCOUNTER — Ambulatory Visit (HOSPITAL_COMMUNITY)
Admission: RE | Admit: 2023-11-22 | Discharge: 2023-11-22 | Disposition: A | Source: Ambulatory Visit | Attending: Physician Assistant

## 2023-11-22 ENCOUNTER — Encounter (HOSPITAL_COMMUNITY): Payer: Self-pay

## 2023-11-22 DIAGNOSIS — Z87891 Personal history of nicotine dependence: Secondary | ICD-10-CM | POA: Insufficient documentation

## 2023-11-22 DIAGNOSIS — D61818 Other pancytopenia: Secondary | ICD-10-CM | POA: Diagnosis not present

## 2023-11-22 DIAGNOSIS — M3500 Sicca syndrome, unspecified: Secondary | ICD-10-CM | POA: Diagnosis not present

## 2023-11-22 DIAGNOSIS — C9200 Acute myeloblastic leukemia, not having achieved remission: Secondary | ICD-10-CM | POA: Diagnosis present

## 2023-11-22 DIAGNOSIS — Z791 Long term (current) use of non-steroidal anti-inflammatories (NSAID): Secondary | ICD-10-CM | POA: Diagnosis not present

## 2023-11-22 DIAGNOSIS — Z79899 Other long term (current) drug therapy: Secondary | ICD-10-CM | POA: Insufficient documentation

## 2023-11-22 DIAGNOSIS — D72819 Decreased white blood cell count, unspecified: Secondary | ICD-10-CM

## 2023-11-22 DIAGNOSIS — M1990 Unspecified osteoarthritis, unspecified site: Secondary | ICD-10-CM | POA: Insufficient documentation

## 2023-11-22 HISTORY — DX: Neutropenia, unspecified: D70.9

## 2023-11-22 HISTORY — DX: Decreased white blood cell count, unspecified: D72.819

## 2023-11-22 HISTORY — DX: Dyspnea, unspecified: R06.00

## 2023-11-22 LAB — CBC WITH DIFFERENTIAL/PLATELET
Abs Immature Granulocytes: 0.01 K/uL (ref 0.00–0.07)
Basophils Absolute: 0 K/uL (ref 0.0–0.1)
Basophils Relative: 0 %
Eosinophils Absolute: 0 K/uL (ref 0.0–0.5)
Eosinophils Relative: 3 %
HCT: 34.8 % — ABNORMAL LOW (ref 36.0–46.0)
Hemoglobin: 11.3 g/dL — ABNORMAL LOW (ref 12.0–15.0)
Immature Granulocytes: 1 %
Lymphocytes Relative: 61 %
Lymphs Abs: 0.9 K/uL (ref 0.7–4.0)
MCH: 33.5 pg (ref 26.0–34.0)
MCHC: 32.5 g/dL (ref 30.0–36.0)
MCV: 103.3 fL — ABNORMAL HIGH (ref 80.0–100.0)
Monocytes Absolute: 0.1 K/uL (ref 0.1–1.0)
Monocytes Relative: 5 %
Neutro Abs: 0.4 K/uL — CL (ref 1.7–7.7)
Neutrophils Relative %: 30 %
Platelets: 133 K/uL — ABNORMAL LOW (ref 150–400)
RBC: 3.37 MIL/uL — ABNORMAL LOW (ref 3.87–5.11)
RDW: 13.5 % (ref 11.5–15.5)
Smear Review: NORMAL
WBC: 1.4 K/uL — CL (ref 4.0–10.5)
nRBC: 0 % (ref 0.0–0.2)

## 2023-11-22 MED ORDER — DIPHENHYDRAMINE HCL 50 MG/ML IJ SOLN
INTRAMUSCULAR | Status: AC
  Filled 2023-11-22: qty 1

## 2023-11-22 MED ORDER — MIDAZOLAM HCL (PF) 2 MG/2ML IJ SOLN
INTRAMUSCULAR | Status: AC | PRN
  Administered 2023-11-22 (×2): 1 mg via INTRAVENOUS

## 2023-11-22 MED ORDER — DIPHENHYDRAMINE HCL 50 MG/ML IJ SOLN
INTRAMUSCULAR | Status: AC | PRN
Start: 2023-11-22 — End: 2023-11-22
  Administered 2023-11-22: 25 mg via INTRAVENOUS

## 2023-11-22 MED ORDER — SODIUM CHLORIDE 0.9 % IV SOLN: INTRAVENOUS | Status: DC

## 2023-11-22 MED ORDER — MIDAZOLAM HCL 2 MG/2ML IJ SOLN
INTRAMUSCULAR | Status: AC
  Filled 2023-11-22: qty 2

## 2023-11-22 MED ORDER — FENTANYL CITRATE (PF) 100 MCG/2ML IJ SOLN
INTRAMUSCULAR | Status: AC
  Filled 2023-11-22: qty 2

## 2023-11-22 MED ORDER — FENTANYL CITRATE (PF) 100 MCG/2ML IJ SOLN
INTRAMUSCULAR | Status: AC | PRN
  Administered 2023-11-22 (×2): 50 ug via INTRAVENOUS

## 2023-11-22 NOTE — Discharge Instructions (Signed)
 PLEASE CONTACT INTERVENTIONAL RADIOLOGY CLINIC AT [phone] WITH ANY QUESTIONS OR CONCERNS  MAY REMOVE DRESSING AND SHOWER TOMORROW  IF YOU DEVELOP ANY FEVER, CHILLS OR NOT FEELING WELL NOTIFY DR [facility] OFFICE IMMEDIATELY

## 2023-11-22 NOTE — Procedures (Signed)
 Vascular and Interventional Radiology Procedure Note  Patient: Joanna Mullins DOB: 03-29-1965 Medical Record Number: [id number] Note Date/Time: 11/22/23 10:23 AM   Performing Physician: Thom Hall, MD Assistant(s): None  Diagnosis: Leukopenia  Procedure: BONE MARROW ASPIRATION and BIOPSY  Anesthesia: Conscious Sedation Complications: None Estimated Blood Loss: Minimal Specimens: Sent for Pathology  Findings:  Successful CT-guided bone marrow aspiration and biopsy A total of 1 cores were obtained. Hemostasis of the tract was achieved using Manual Pressure.  Plan: Bed rest for 1 hours.  See detailed procedure note with images in PACS. The patient tolerated the procedure well without incident or complication and was returned to Recovery in stable condition.    Thom Hall, MD Vascular [facility] Interventional Radiology [facility]   Pager. [phone] Clinic. [phone]

## 2023-11-24 NOTE — Progress Notes (Deleted)
 [facility] OFFICE PROGRESS NOTE  [name], MD [address] [place] [address]  DIAGNOSIS: ***Leukopenia  PRIOR THERAPY: None  CURRENT THERAPY: ***  INTERVAL HISTORY: Joanna Mullins 58 y.o. female returns to the clinic today for a follow up visit. The ptaient was referred to the clinic for leukopenia.   The etiology is unclear and she had lab work including B12, CBC, CMP, folate, B12, hepatitis, and HIV testing.   She has known Sjogren's.   We recommended bone marrow biopsy and aspirate which showed ***. She tolerated this procedure **.   She has avoided taking meloxicam.   Overall she is feeling fair she does report that she is more fatigued towards the end of the day which she has noticed over the past few weeks. She reports she rarely gets sick. She has not been seriously ill since before COVID in 2020.    She started probiotic recently because she struggles with constipation mainly which she has done so for most of her life.  She takes her migraine medication intermittently.  The most recent was a few nights ago.  He denies any dietary restrictions.   She does not have chronic kidney disease.  She denies any known exposure to hepatitis or HIV.  She denies any known tick bites but reports she [place] a reaction [place] and had swelling in her posterior arm which has since subsided.  She denies any fever, chills, or unexplained weight loss.   The patient denies any known liver disease such as hepatitis, NAFLD, or cirrhosis.  She drinks less than 4 alcoholic beverages per week.   She is here for evaluation and to review her bone marrow biopsy results and next steps.;     MEDICAL HISTORY: Past Medical History:  Diagnosis Date   Dyspnea    Leukopenia    Neutropenia     ALLERGIES:  is allergic to food.  MEDICATIONS:  Current Outpatient Medications  Medication Sig Dispense Refill   azelastine (ASTELIN) 0.1 %  nasal spray Place 2 sprays into both nostrils as needed for rhinitis. Use in each nostril as directed     cevimeline (EVOXAC) 30 MG capsule Take 30 mg by mouth daily. Alternate with pilocarpine     cyclobenzaprine (FLEXERIL) 10 MG tablet Take 10 mg by mouth as needed.     fluticasone (FLONASE) 50 MCG/ACT nasal spray Place 1 spray into both nostrils as needed for allergies or rhinitis.     meloxicam (MOBIC) 15 MG tablet Take 15 mg by mouth daily.     pilocarpine (SALAGEN) 5 MG tablet Take 5 mg by mouth daily.     Probiotic CHEW Chew by mouth 2 (two) times daily. Seed DS-01     propranolol (INDERAL) 20 MG tablet Take 20 mg by mouth daily.     SUMAtriptan (IMITREX) 100 MG tablet Take 100 mg by mouth as needed for migraine. May repeat in 2 hours if headache persists or recurs.     No current facility-administered medications for this visit.    SURGICAL HISTORY:  Past Surgical History:  Procedure Laterality Date   NO PAST SURGERIES      REVIEW OF SYSTEMS:   Review of Systems  Constitutional: Negative for appetite change, chills, fatigue, fever and unexpected weight change.  HENT:   Negative for mouth sores, nosebleeds, sore throat and trouble swallowing.   Eyes: Negative for eye problems and icterus.  Respiratory: Negative  for cough, hemoptysis, shortness of breath and wheezing.   Cardiovascular: Negative for chest pain and leg swelling.  Gastrointestinal: Negative for abdominal pain, constipation, diarrhea, nausea and vomiting.  Genitourinary: Negative for bladder incontinence, difficulty urinating, dysuria, frequency and hematuria.   Musculoskeletal: Negative for back pain, gait problem, neck pain and neck stiffness.  Skin: Negative for itching and rash.  Neurological: Negative for dizziness, extremity weakness, gait problem, headaches, light-headedness and seizures.  Hematological: Negative for adenopathy. Does not bruise/bleed easily.  Psychiatric/Behavioral: Negative for confusion,  depression and sleep disturbance. The patient is not nervous/anxious.     PHYSICAL EXAMINATION:  There were no vitals taken for this visit.  ECOG PERFORMANCE STATUS: {CHL ONC ECOG [id number]  Physical Exam  Constitutional: Oriented to person, place, and time and well-developed, well-nourished, and in no distress. No distress.  HENT:  Head: Normocephalic and atraumatic.  Mouth/Throat: Oropharynx is clear and moist. No oropharyngeal exudate.  Eyes: Conjunctivae are normal. Right eye exhibits no discharge. Left eye exhibits no discharge. No scleral icterus.  Neck: Normal range of motion. Neck supple.  Cardiovascular: Normal rate, regular rhythm, normal heart sounds and intact distal pulses.   Pulmonary/Chest: Effort normal and breath sounds normal. No respiratory distress. No wheezes. No rales.  Abdominal: Soft. Bowel sounds are normal. Exhibits no distension and no mass. There is no tenderness.  Musculoskeletal: Normal range of motion. Exhibits no edema.  Lymphadenopathy:    No cervical adenopathy.  Neurological: Alert and oriented to person, place, and time. Exhibits normal muscle tone. Gait normal. Coordination normal.  Skin: Skin is warm and dry. No rash noted. Not diaphoretic. No erythema. No pallor.  Psychiatric: Mood, memory and judgment normal.  Vitals reviewed.  LABORATORY DATA: Lab Results  Component Value Date   WBC 1.4 (LL) 11/22/2023   HGB 11.3 (L) 11/22/2023   HCT 34.8 (L) 11/22/2023   MCV 103.3 (H) 11/22/2023   PLT 133 (L) 11/22/2023      Chemistry      Component Value Date/Time   NA 138 10/22/2023 0849   K 4.3 10/22/2023 0849   CL 102 10/22/2023 0849   CO2 30 10/22/2023 0849   BUN 19 10/22/2023 0849   CREATININE 0.83 10/22/2023 0849      Component Value Date/Time   CALCIUM 9.7 10/22/2023 0849   ALKPHOS 94 10/22/2023 0849   AST 18 10/22/2023 0849   ALT 18 10/22/2023 0849   BILITOT 0.5 10/22/2023 0849       RADIOGRAPHIC STUDIES:  CT BONE MARROW  BIOPSY & ASPIRATION Result Date: 11/22/2023 INDICATION: leukopenia unclear etiology rule out bone marrow EXAM: CT GUIDED BONE MARROW ASPIRATION AND CORE BIOPSY MEDICATIONS: 25 mg Benadryl IV. ANESTHESIA/SEDATION: Moderate (conscious) sedation was employed during this procedure. A total of Versed 2 mg and Fentanyl 100 mcg was administered intravenously. Moderate Sedation Time: 10 minutes. The patient's level of consciousness and vital signs were monitored continuously by radiology nursing throughout the procedure under my direct supervision. FLUOROSCOPY TIME:  CT dose; 138 mGycm COMPLICATIONS: None immediate. Estimated blood loss: <5 mL PROCEDURE: RADIATION DOSE REDUCTION: This exam was performed according to the departmental dose-optimization program which includes automated exposure control, adjustment of the mA and/or kV according to patient size and/or use of iterative reconstruction technique. Informed written consent was obtained from the patient after a thorough discussion of the procedural risks, benefits and alternatives. All questions were addressed. Maximal Sterile Barrier Technique was utilized including caps, mask, sterile gowns, sterile gloves, sterile drape, hand hygiene and skin  antiseptic. A timeout was performed prior to the initiation of the procedure. The patient was positioned prone and non-contrast localization CT was performed of the pelvis to demonstrate the iliac marrow spaces. Under sterile conditions and local anesthesia, an 11 gauge coaxial bone biopsy needle was advanced into the RIGHT iliac marrow space. Needle position was confirmed with CT imaging. Initially, bone marrow aspiration was performed. Next, the 11 gauge outer cannula was utilized to obtain a single iliac bone marrow core biopsy. Needle was removed. Hemostasis was obtained with compression. The patient tolerated the procedure well. Samples were prepared with the cytotechnologist. IMPRESSION: Successful CT-guided bone  marrow aspiration and biopsy. Thom Hall, MD [facility] Electronically Signed   By: Thom Hall M.D.   On: 11/22/2023 12:02     ASSESSMENT/PLAN:  This is a very pleasant 58 year old female with Sjogren's who was referred to the clinic for leukopenia.    Her bone marrow biopsy and aspirate shows ***  The patient was seen with Dr. Sherrod. Labs were reviewed. Dr. Sherrod reviewed the results of the bone marrow biopsy which showed ***  Dr. Sherrod recommends ***  I will arrange for ***.   F/U***  The patient was advised to call immediately if she has any concerning symptoms in the interval. The patient voices understanding of current disease status and treatment options and is in agreement with the current care plan. All questions were answered. The patient knows to call the clinic with any problems, questions or concerns. We can certainly see the patient much sooner if necessary       No orders of the defined types were placed in this encounter.    I spent {CHL ONC TIME VISIT - DTPQU:[id number]} counseling the patient face to face. The total time spent in the appointment was {CHL ONC TIME VISIT - DTPQU:[id number]}.  Wanetta Funderburke L Kysen Wetherington, PA-C 11/24/23

## 2023-11-26 ENCOUNTER — Encounter (HOSPITAL_COMMUNITY): Payer: Self-pay

## 2023-11-26 ENCOUNTER — Telehealth: Payer: Self-pay | Admitting: Physician Assistant

## 2023-11-26 ENCOUNTER — Other Ambulatory Visit: Payer: Self-pay

## 2023-11-26 DIAGNOSIS — C9200 Acute myeloblastic leukemia, not having achieved remission: Secondary | ICD-10-CM

## 2023-11-26 LAB — SURGICAL PATHOLOGY

## 2023-11-26 NOTE — Telephone Encounter (Signed)
 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 We will call [facility] to transfer her care and place urgent referral. I called the patient to let her know this. She does not need to be seen here this week. She expressed understanding.

## 2023-11-27 ENCOUNTER — Telehealth: Payer: Self-pay

## 2023-11-27 NOTE — Telephone Encounter (Signed)
 Faxed new patient referral to [facility] with confirmation at [phone].

## 2023-11-27 NOTE — Telephone Encounter (Signed)
 Spoke with Danielle @ [facility] to see if new patient referral was received.  Joanna Mullins states that referral has been received.  She states that the next available appt is on 11/29/23 @ 830 AM.  Informed her that if patient does not accept appt, to give our office a call to let us  know.  Otherwise, patients care is transferred to [facility]

## 2023-11-28 ENCOUNTER — Telehealth: Payer: Self-pay

## 2023-11-28 ENCOUNTER — Inpatient Hospital Stay

## 2023-11-28 ENCOUNTER — Inpatient Hospital Stay: Admitting: Physician Assistant

## 2023-11-28 NOTE — Telephone Encounter (Signed)
 Spoke with Dr. Reyna @ [facility] in regards to recent referral for AML.  She inquired whether our office performed molecular studies on the bone marrow biopsy.  Informed her that was not performed.  She states that she will call patient and see if she is okay with getting another bone marrow biopsy tomorrow in their clinic.  Informed Dr. Reyna that if anything else is needed from our office, don't hesitate to call.  She voiced understanding.

## 2023-11-30 ENCOUNTER — Encounter (HOSPITAL_COMMUNITY): Payer: Self-pay

## 2023-12-03 ENCOUNTER — Encounter (HOSPITAL_COMMUNITY): Payer: Self-pay | Admitting: Physician Assistant

## 2023-12-04 ENCOUNTER — Telehealth: Payer: Self-pay

## 2023-12-04 ENCOUNTER — Encounter (HOSPITAL_COMMUNITY): Payer: Self-pay

## 2023-12-04 NOTE — Telephone Encounter (Signed)
 Faxed patients FISH analysis AML results to Dr. Regis office with confirmation at [phone].

## 2023-12-12 ENCOUNTER — Encounter (HOSPITAL_COMMUNITY): Payer: Self-pay

## 2024-01-25 ENCOUNTER — Telehealth: Payer: Self-pay | Admitting: Physician Assistant

## 2024-01-25 NOTE — Telephone Encounter (Signed)
 Received message from [facility] (Dr. Reyna) regarding this patient. They are requesting supportive care to be provided closer to home (here at [facility]). The patient will require G-CSF injection on 02/05/24, twice-weekly labs, and availability for same-day transfusion if needed. [facility] will fax exact dates, times, parameters, and orders. Sharing for team awareness. Once dates received, will reach out to scheduling to help facilitate scheduling. They also require at twice a week lab appointments to have vital signs recorded.

## 2024-01-29 ENCOUNTER — Telehealth: Payer: Self-pay | Admitting: Medical Oncology

## 2024-01-29 NOTE — Telephone Encounter (Signed)
-----   Message from Cassandra L Heilingoetter sent at 01/25/2024  2:55 PM EST ----- Regarding: Supportive Care Hey Team,   I received a call from Dr. Reyna from [facility] today. She updated me on this patient who is in remission for AML but they are performing some other therapy soon and require some supportive care, vitals, and transfusion support here locally if possible for 1 month. The coordinator at [facility] will fax us  the exact dates and what is needed. If you can please be on the look out for the fax and coordinate labs with vitals and hold spots for transfusion support on the requested days and coordinate with scheduling. She also will need neulasta here locally on 02/05/24. Sounds like she needs labs   For the future, Dr. Reyna gave us  her cell phone number should we have any leukemia patient's in the future [phone]  [facility]

## 2024-01-29 NOTE — Telephone Encounter (Signed)
 LVM for Joanna Mullins , RN to return my call so we can set up pts future appts.

## 2024-01-31 ENCOUNTER — Telehealth: Payer: Self-pay

## 2024-01-31 NOTE — Telephone Encounter (Signed)
 Spoke with Maxwell from [place] regarding the patients treatment plan. Patient will receive tx at [facility] every 6 weeks and GCSF injection at [facility].  At this time, prior authorization for the G-CSF injection is pending. Bari stated she is preparing the treatment protocols and guidelines and will fax them to our office once completed. Patient appointments were scheduled as follows: G-CSF injection on 02/05/24 at 3 PM, port flush with lab appointment on 02/07/24 at 8 AM, and follow-up visit with Dr. Sherrod on 02/07/24 at 8:30 AM.

## 2024-02-01 ENCOUNTER — Other Ambulatory Visit: Payer: Self-pay | Admitting: Physician Assistant

## 2024-02-01 ENCOUNTER — Encounter: Payer: Self-pay | Admitting: Physician Assistant

## 2024-02-01 DIAGNOSIS — C9200 Acute myeloblastic leukemia, not having achieved remission: Secondary | ICD-10-CM

## 2024-02-01 NOTE — Progress Notes (Signed)
 Received fax from [facility] on 02/01/2024 regarding the attached patient.  We received orders for the following:  Lab draw appointment date/time: Thursday, January 15 at 8 AM followed by visit with Dr. Sherrod [place] factor injection: See January 13 at 3 PM.  Please administer Neulasta /biosimilar 60 mg subcutaneous injection x 1 dose.  Authorization is pending.  All results need to be faxed to [phone] before 3 PM.  Critical values need to be called to [facility] at [phone].  - CBC/differential is needed on 1/15, 1/20, 1/23, 1/26 and then can keep on Monday and Thursday schedule  - CMP is needed on 1/15, 1/20, 1/23, 1/26, and can keep on Monday/Thursday schedule.  - Magnesium is needed on 1/15, 1/20, 1/23, 1/26 and can keep on Monday/Thursday scheduled.  -Vital signs need to be checked along with labs on 1/15, 1/20, 1/23, and 1/26  - Do not administer any growth factors without consulting [facility] physician, PAs, or nurse practitioners.  -Other: Please transfuse for platelet count less than 20,000 with 1 unit of pheresis platelets and hemoglobin less than 8 with 2 units of packed RBCs and hemoglobin less than 9 with 1 unit of packed red blood cells if symptomatic.  All blood products need to be irradiated  The patient has a Port-A-Cath.

## 2024-02-03 NOTE — Care Plan (Signed)
 Problem: PAIN - ADULT  Goal: Verbalizes/displays adequate comfort level or baseline comfort level  Description: INTERVENTIONS:  1. Encourage pt to monitor pain and request assistance  2. Assess pain using appropriate pain scale  3. Administer analgesics based on type and severity of pain and evaluate response  4. Implement non-pharmacological measures as appropriate and evaluate response  5. Consider cultural and social influences on pain and pain management  6. Notify LIP if interventions unsuccessful or patient reports new pain  Outcome: Progressing     Problem: INFECTION - ADULT  Goal: Absence of infection during hospitalization  Description: INTERVENTIONS:  1. Assess and monitor for signs and symptoms of infection  2. Monitor lab/diagnostic results  3. Monitor all insertion sites i.e., indwelling lines, tubes and drains  4. Monitor endotracheal (as able) and nasal secretions for changes in amount and color  5. Institute appropriate cooling/warming therapies per order  6. Administer medications as ordered  7. Instruct and encourage patient and family to use good hand hygiene technique  8. Identify and instruct in appropriate isolation precautions for identified infection/condition  Outcome: Progressing  Goal: Absence of fever/infection during anticipated neutropenic period  Description: INTERVENTIONS  1. Monitor WBC  2. Administer growth factors as ordered  3. Implement neutropenic guidelines as ordered  Outcome: Progressing     Problem: Safety - Adult  Goal: Free from fall injury  Description: INTERVENTIONS:  1. Assess pt frequently for physical needs  2. Identify cognitive and physical deficits and behaviors that affect risk of falls.  3. Institute fall precautions as indicated by assessment.  4. Educate pt/family on patient safety including physical limitations  5. Instruct pt to call for assistance with activity based on assessment  6. Modify environment to reduce risk of injury  7. Consider OT/PT consult  to assist with strengthening/mobility  Outcome: Progressing  Goal: Absence of infection during hospitalization  Description: INTERVENTIONS:  1. Assess and monitor for signs and symptoms of infection  2. Monitor lab/diagnostic results  3. Monitor all insertion sites i.e., indwelling lines, tubes and drains  4. Monitor endotracheal (as able) and nasal secretions for changes in amount and color  5. Institute appropriate cooling/warming therapies per order  6. Administer medications as ordered  7. Instruct and encourage patient and family to use good hand hygiene technique  8. Identify and instruct in appropriate isolation precautions for identified infection/condition  Outcome: Progressing     Problem: DISCHARGE PLANNING  Goal: Discharge to home or other facility with appropriate resources  Description: INTERVENTIONS:  1. Identify barriers to discharge w/pt and caregiver  2. Arrange for needed discharge resources and transportation as appropriate  3. Identify discharge learning needs (meds, wound care, etc)  4. Arrange for interpreters to assist at discharge as needed  5. Refer to Case Management Department for coordinating discharge planning if the patient needs post-hospital services based on physician order or complex needs related to functional status, cognitive ability or social support system  Outcome: Progressing     Problem: Chronic Conditions and Co-Morbidities  Goal: Patient's chronic conditions and co-morbidity symptoms are monitored and maintained or improved  Description: INTERVENTIONS:  1. Monitor and assess patient's chronic conditions and comorbid symptoms for stability, deterioration, or improvement  2. Collaborate with multidisciplinary team to address chronic and comorbid conditions and prevent exacerbation or deterioration  3. Update acute care plan with appropriate goals if chronic or comorbid symptoms are exacerbated and prevent overall improvement and discharge  Outcome: Progressing

## 2024-02-04 ENCOUNTER — Telehealth: Payer: Self-pay | Admitting: Medical Oncology

## 2024-02-04 ENCOUNTER — Other Ambulatory Visit: Payer: Self-pay | Admitting: Physician Assistant

## 2024-02-04 NOTE — Telephone Encounter (Signed)
 Vivian , RN at [facility] said pt completed her chemo today at [phone]. She said to cancel her injection on 02/05/2024 because GCSF still not authorized. She requested to schedule the injection on 01/15 to be given by 9 am.om flush room with lab draw.    Pts lab at [facility] today were reviewed with plts = [phone]

## 2024-02-04 NOTE — Telephone Encounter (Signed)
 Received fax detailing auth number for [employer]den[employer]  administration on Thursday. Information faxed to Darlena and sent to be scanned . A copy was put in the [facility] notebook at POD 3.

## 2024-02-05 ENCOUNTER — Inpatient Hospital Stay

## 2024-02-07 ENCOUNTER — Inpatient Hospital Stay: Attending: Internal Medicine | Admitting: Internal Medicine

## 2024-02-07 ENCOUNTER — Inpatient Hospital Stay

## 2024-02-07 VITALS — BP 120/64 | HR 96 | Temp 98.0°F | Resp 17 | Ht 66.0 in | Wt 140.0 lb

## 2024-02-07 VITALS — BP 130/76 | HR 94 | Temp 98.7°F | Resp 16

## 2024-02-07 DIAGNOSIS — C9200 Acute myeloblastic leukemia, not having achieved remission: Secondary | ICD-10-CM | POA: Insufficient documentation

## 2024-02-07 DIAGNOSIS — D696 Thrombocytopenia, unspecified: Secondary | ICD-10-CM | POA: Diagnosis not present

## 2024-02-07 DIAGNOSIS — D701 Agranulocytosis secondary to cancer chemotherapy: Secondary | ICD-10-CM | POA: Insufficient documentation

## 2024-02-07 DIAGNOSIS — D72819 Decreased white blood cell count, unspecified: Secondary | ICD-10-CM

## 2024-02-07 DIAGNOSIS — C9201 Acute myeloblastic leukemia, in remission: Secondary | ICD-10-CM

## 2024-02-07 DIAGNOSIS — E876 Hypokalemia: Secondary | ICD-10-CM | POA: Insufficient documentation

## 2024-02-07 LAB — CMP (CANCER CENTER ONLY)
ALT: 18 U/L (ref 0–44)
AST: 24 U/L (ref 15–41)
Albumin: 4.4 g/dL (ref 3.5–5.0)
Alkaline Phosphatase: 73 U/L (ref 38–126)
Anion gap: 11 (ref 5–15)
BUN: 15 mg/dL (ref 6–20)
CO2: 28 mmol/L (ref 22–32)
Calcium: 9.5 mg/dL (ref 8.9–10.3)
Chloride: 100 mmol/L (ref 98–111)
Creatinine: 0.77 mg/dL (ref 0.44–1.00)
GFR, Estimated: >60 mL/min
Glucose, Bld: 99 mg/dL (ref 70–99)
Potassium: 4.3 mmol/L (ref 3.5–5.1)
Sodium: 138 mmol/L (ref 135–145)
Total Bilirubin: 0.8 mg/dL (ref 0.0–1.2)
Total Protein: 7.4 g/dL (ref 6.5–8.1)

## 2024-02-07 LAB — CBC WITH DIFFERENTIAL (CANCER CENTER ONLY)
Abs Immature Granulocytes: 0.01 K/uL (ref 0.00–0.07)
Basophils Absolute: 0 K/uL (ref 0.0–0.1)
Basophils Relative: 1 %
Eosinophils Absolute: 0.1 K/uL (ref 0.0–0.5)
Eosinophils Relative: 6 %
HCT: 29.6 % — ABNORMAL LOW (ref 36.0–46.0)
Hemoglobin: 10.6 g/dL — ABNORMAL LOW (ref 12.0–15.0)
Immature Granulocytes: 1 %
Lymphocytes Relative: 20 %
Lymphs Abs: 0.2 K/uL — ABNORMAL LOW (ref 0.7–4.0)
MCH: 33.3 pg (ref 26.0–34.0)
MCHC: 35.8 g/dL (ref 30.0–36.0)
MCV: 93.1 fL (ref 80.0–100.0)
Monocytes Absolute: 0 K/uL — ABNORMAL LOW (ref 0.1–1.0)
Monocytes Relative: 3 %
Neutro Abs: 0.8 K/uL — ABNORMAL LOW (ref 1.7–7.7)
Neutrophils Relative %: 69 %
Platelet Count: 63 K/uL — ABNORMAL LOW (ref 150–400)
RBC: 3.18 MIL/uL — ABNORMAL LOW (ref 3.87–5.11)
RDW: 15.4 % (ref 11.5–15.5)
WBC Count: 1.1 K/uL — ABNORMAL LOW (ref 4.0–10.5)
nRBC: 0 % (ref 0.0–0.2)

## 2024-02-07 LAB — SAMPLE TO BLOOD BANK

## 2024-02-07 LAB — MAGNESIUM: Magnesium: 2.2 mg/dL (ref 1.7–2.4)

## 2024-02-07 MED ORDER — PEGFILGRASTIM-CBQV 6 MG/0.6ML ~~LOC~~ SOSY
6.0000 mg | PREFILLED_SYRINGE | Freq: Once | SUBCUTANEOUS | Status: AC
  Administered 2024-02-07: 6 mg via SUBCUTANEOUS
  Filled 2024-02-07: qty 0.6

## 2024-02-07 NOTE — Progress Notes (Signed)
 "     [facility] Telephone:([phone]) [phone]   [phone]) [phone]  OFFICE PROGRESS NOTE  [name], MD [address] 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  PRIOR THERAPY: Status post induction chemotherapy with 7+3 treatment with cytarabine and anthracycline with selinexor at [facility]  CURRENT THERAPY: Consolidation treatment with cytarabine  INTERVAL HISTORY: Joanna Mullins 59 y.o. female returns to the clinic today for follow-up visit Discussed the use of AI scribe software for clinical note transcription with the patient, who gave verbal consent to proceed.  History of Present Illness Joanna Mullins is a 59 year old female with acute myeloid leukemia undergoing consolidation chemotherapy who presents for evaluation and repeat blood work during ongoing treatment.  She is in the consolidation phase of treatment for acute myeloid leukemia, having completed one cycle of cytarabine and Selinexor, with two additional cycles planned. Induction chemotherapy was previously administered at [facility], which she tolerated well, and her day 12 post-induction bone marrow evaluation was favorable. A repeat bone marrow biopsy is scheduled for February. She remains hospitalized during chemotherapy cycles, with her next admission planned for February 19th through February 22nd.  Her recent white blood cell count was 1.1 x10^3/L and her absolute neutrophil count was 800/L. She received a Neulasta  (pegfilgrastim ) injection for white blood cell support. Her recent hemoglobin was 10.6 g/dL and her platelets were 63,000/L.  She reports minimal nausea and mild increased shortness of breath. She denies neurological symptoms. She describes mild visual disturbances, specifically difficulty focusing, for which she has been provided eye drops. No other significant symptoms were reported.     MEDICAL HISTORY: Past Medical  History:  Diagnosis Date   Dyspnea    Leukopenia    Neutropenia     ALLERGIES:  is allergic to food.  MEDICATIONS:  Current Outpatient Medications  Medication Sig Dispense Refill   azelastine (ASTELIN) 0.1 % nasal spray Place 2 sprays into both nostrils as needed for rhinitis. Use in each nostril as directed     cevimeline (EVOXAC) 30 MG capsule Take 30 mg by mouth daily. Alternate with pilocarpine     cyclobenzaprine (FLEXERIL) 10 MG tablet Take 10 mg by mouth as needed.     fluticasone (FLONASE) 50 MCG/ACT nasal spray Place 1 spray into both nostrils as needed for allergies or rhinitis.     meloxicam (MOBIC) 15 MG tablet Take 15 mg by mouth daily.     pilocarpine (SALAGEN) 5 MG tablet Take 5 mg by mouth daily.     Probiotic CHEW Chew by mouth 2 (two) times daily. Seed DS-01     propranolol (INDERAL) 20 MG tablet Take 20 mg by mouth daily.     SUMAtriptan (IMITREX) 100 MG tablet Take 100 mg by mouth as needed for migraine. May repeat in 2 hours if headache persists or recurs.     No current facility-administered medications for this visit.    SURGICAL HISTORY:  Past Surgical History:  Procedure Laterality Date   NO PAST SURGERIES      REVIEW OF SYSTEMS:  Constitutional: positive for fatigue Eyes: positive for irritation Ears, nose, mouth, throat, and face: negative Respiratory: negative Cardiovascular: negative Gastrointestinal: negative Genitourinary:negative Integument/breast: negative Hematologic/lymphatic: negative Musculoskeletal:negative Neurological: negative Behavioral/Psych: negative Endocrine: negative Allergic/Immunologic: negative   PHYSICAL EXAMINATION: General appearance: alert, cooperative, fatigued, and no distress Head: Normocephalic, without obvious abnormality, atraumatic Neck: no adenopathy, no JVD, supple, symmetrical, trachea midline, and thyroid not  enlarged, symmetric, no tenderness/mass/nodules Lymph nodes: Cervical, supraclavicular, and  axillary nodes normal. Resp: clear to auscultation bilaterally Back: symmetric, no curvature. ROM normal. No CVA tenderness. Cardio: regular rate and rhythm, S1, S2 normal, no murmur, click, rub or gallop GI: soft, non-tender; bowel sounds normal; no masses,  no organomegaly Extremities: extremities normal, atraumatic, no cyanosis or edema Neurologic: Alert and oriented X 3, normal strength and tone. Normal symmetric reflexes. Normal coordination and gait  ECOG PERFORMANCE STATUS: 1 - Symptomatic but completely ambulatory  Blood pressure 120/64, pulse 96, temperature 98 F (36.7 C), temperature source Temporal, resp. rate 17, height 5' 6 (1.676 m), weight 140 lb (63.5 kg), SpO2 100%.  LABORATORY DATA: Lab Results  Component Value Date   WBC 1.1 (L) 02/07/2024   HGB 10.6 (L) 02/07/2024   HCT 29.6 (L) 02/07/2024   MCV 93.1 02/07/2024   PLT 63 (L) 02/07/2024      Chemistry      Component Value Date/Time   NA 138 10/22/2023 0849   K 4.3 10/22/2023 0849   CL 102 10/22/2023 0849   CO2 30 10/22/2023 0849   BUN 19 10/22/2023 0849   CREATININE 0.83 10/22/2023 0849      Component Value Date/Time   CALCIUM 9.7 10/22/2023 0849   ALKPHOS 94 10/22/2023 0849   AST 18 10/22/2023 0849   ALT 18 10/22/2023 0849   BILITOT 0.5 10/22/2023 0849       RADIOGRAPHIC STUDIES: No results found.  ASSESSMENT AND PLAN: This is a very pleasant 59 years old white female diagnosed with acute myeloid leukemia in October 2025 and underwent induction chemotherapy with 7+3 regimen with cytarabine and anthracycline in addition to selinexor with good response.  She is currently undergoing consolidation treatment with cytarabine and selinexor status post 1 cycle. Assessment and Plan Assessment & Plan Acute myeloid leukemia undergoing consolidation chemotherapy She is receiving consolidation chemotherapy with cytarabine and Selinexor following a favorable response to induction therapy. She has completed one  cycle and is scheduled for two additional cycles. She is tolerating therapy with only mild nausea, mild dyspnea, and minor visual disturbances, managed with supportive care. She remains clinically stable without transfusion requirements. Coordination for repeat bone marrow biopsy in February is ongoing. - Continued consolidation chemotherapy with cytarabine and Selinexor as scheduled. - Coordinated with outside facility for repeat bone marrow biopsy in February. - Monitored laboratory parameters for cytopenias and transfusion needs. - Scheduled follow-up for Neulasta  injection on February 16, 2024.  Chemotherapy-induced neutropenia She has chemotherapy-induced neutropenia with an absolute neutrophil count of 800. Neulasta  (pegfilgrastim ) was administered to support neutrophil recovery. Ongoing preauthorization for Neulasta  is in place. She is at increased risk for infection and requires close monitoring. No transfusion is currently required for anemia or thrombocytopenia. - Administered Neulasta  injection for neutrophil recovery. - Ordered next Neulasta  injection for February 16, 2024, post-chemotherapy. - Reviewed hemoglobin and platelet counts; no transfusion required. - Maintained preauthorization for Neulasta  to ensure uninterrupted therapy. She was advised to call immediately if she has any other concerning symptoms in the interval.   The patient voices understanding of current disease status and treatment options and is in agreement with the current care plan.  All questions were answered. The patient knows to call the clinic with any problems, questions or concerns. We can certainly see the patient much sooner if necessary.  The total time spent in the appointment was 30 minutes including review of chart and various tests results, discussions about plan of care and  coordination of care plan .   Disclaimer: This note was dictated with voice recognition software. Similar sounding words can  inadvertently be transcribed and may not be corrected upon review.        "

## 2024-02-08 ENCOUNTER — Telehealth: Payer: Self-pay | Admitting: Internal Medicine

## 2024-02-08 NOTE — Telephone Encounter (Signed)
 Pt is aware of the appt on 2/26

## 2024-02-09 ENCOUNTER — Inpatient Hospital Stay

## 2024-02-12 ENCOUNTER — Other Ambulatory Visit: Payer: Self-pay

## 2024-02-12 ENCOUNTER — Inpatient Hospital Stay

## 2024-02-12 ENCOUNTER — Other Ambulatory Visit: Payer: Self-pay | Admitting: Physician Assistant

## 2024-02-12 ENCOUNTER — Telehealth: Payer: Self-pay

## 2024-02-12 ENCOUNTER — Inpatient Hospital Stay: Admitting: Physician Assistant

## 2024-02-12 DIAGNOSIS — C9200 Acute myeloblastic leukemia, not having achieved remission: Secondary | ICD-10-CM

## 2024-02-12 DIAGNOSIS — E876 Hypokalemia: Secondary | ICD-10-CM

## 2024-02-12 DIAGNOSIS — C9201 Acute myeloblastic leukemia, in remission: Secondary | ICD-10-CM

## 2024-02-12 LAB — CBC WITH DIFFERENTIAL (CANCER CENTER ONLY)
Abs Immature Granulocytes: 0 K/uL (ref 0.00–0.07)
Basophils Absolute: 0 K/uL (ref 0.0–0.1)
Basophils Relative: 0 %
Eosinophils Absolute: 0 K/uL (ref 0.0–0.5)
Eosinophils Relative: 0 %
HCT: 25.4 % — ABNORMAL LOW (ref 36.0–46.0)
Hemoglobin: 9 g/dL — ABNORMAL LOW (ref 12.0–15.0)
Immature Granulocytes: 0 %
Lymphocytes Relative: 37 %
Lymphs Abs: 0.5 K/uL — ABNORMAL LOW (ref 0.7–4.0)
MCH: 32.5 pg (ref 26.0–34.0)
MCHC: 35.4 g/dL (ref 30.0–36.0)
MCV: 91.7 fL (ref 80.0–100.0)
Monocytes Absolute: 0.1 K/uL (ref 0.1–1.0)
Monocytes Relative: 5 %
Neutro Abs: 0.7 K/uL — ABNORMAL LOW (ref 1.7–7.7)
Neutrophils Relative %: 58 %
Platelet Count: <5 K/uL — CL (ref 150–400)
RBC: 2.77 MIL/uL — ABNORMAL LOW (ref 3.87–5.11)
RDW: 14.1 % (ref 11.5–15.5)
WBC Count: 1.3 K/uL — ABNORMAL LOW (ref 4.0–10.5)
nRBC: 0 % (ref 0.0–0.2)

## 2024-02-12 LAB — CMP (CANCER CENTER ONLY)
ALT: 13 U/L (ref 0–44)
AST: 15 U/L (ref 15–41)
Albumin: 4.5 g/dL (ref 3.5–5.0)
Alkaline Phosphatase: 71 U/L (ref 38–126)
Anion gap: 15 (ref 5–15)
BUN: 19 mg/dL (ref 6–20)
CO2: 23 mmol/L (ref 22–32)
Calcium: 9.3 mg/dL (ref 8.9–10.3)
Chloride: 96 mmol/L — ABNORMAL LOW (ref 98–111)
Creatinine: 0.84 mg/dL (ref 0.44–1.00)
GFR, Estimated: >60 mL/min
Glucose, Bld: 131 mg/dL — ABNORMAL HIGH (ref 70–99)
Potassium: 3.1 mmol/L — ABNORMAL LOW (ref 3.5–5.1)
Sodium: 134 mmol/L — ABNORMAL LOW (ref 135–145)
Total Bilirubin: 0.8 mg/dL (ref 0.0–1.2)
Total Protein: 7.2 g/dL (ref 6.5–8.1)

## 2024-02-12 LAB — MAGNESIUM: Magnesium: 2.1 mg/dL (ref 1.7–2.4)

## 2024-02-12 LAB — ABO/RH: ABO/RH(D): O POS

## 2024-02-12 MED ORDER — DIPHENHYDRAMINE HCL 25 MG PO CAPS
25.0000 mg | ORAL_CAPSULE | Freq: Once | ORAL | Status: AC
  Administered 2024-02-12: 25 mg via ORAL
  Filled 2024-02-12: qty 1

## 2024-02-12 MED ORDER — POTASSIUM CHLORIDE CRYS ER 20 MEQ PO TBCR: 20.0000 meq | EXTENDED_RELEASE_TABLET | Freq: Every day | ORAL | 0 refills | Status: AC

## 2024-02-12 MED ORDER — SODIUM CHLORIDE 0.9% IV SOLUTION
250.0000 mL | INTRAVENOUS | Status: DC
  Administered 2024-02-12: 100 mL via INTRAVENOUS

## 2024-02-12 MED ORDER — ACETAMINOPHEN 325 MG PO TABS
650.0000 mg | ORAL_TABLET | Freq: Once | ORAL | Status: AC
  Administered 2024-02-12: 650 mg via ORAL
  Filled 2024-02-12: qty 2

## 2024-02-12 NOTE — Progress Notes (Signed)
 [name].   Joanna Planas, MD [address] [place] [address]   INTERVAL HISTORY: Joanna Mullins 59 y.o. female returns to the clinic for platelet transfusion today. She has been having a sensation of lateral ear pressure for approximately 2 days.  The pain has not worsened but it has not improved either.  She recently received hospital-based treatment at [facility] and oral treatment.  She received growth factor injection last week.  She is on Claritin daily post injection.  Part of her prophylactic regimen she is on levofloxacin, an antiviral, and an antifungal medication.  She is reporting a 2-day history of bilateral ear pressure which is more pronounced on the left.  She describes it as a sensation of her eardrum moving with respirations.  There is no associated otalgia.  She does have some rhinorrhea without nasal congestion or sinus pressure.  She denies fever, sore throat, or upper respiratory infection.  She does have some neutropenia today.  She also has thrombocytopenia and received 1 unit of platelets.  Denies any abnormal bleeding or bruising including epistaxis, gum bleeding, hemoptysis, or hematemesis.  She denies any signs and symptoms of infection or bleeding precautions.  MEDICAL HISTORY: Past Medical History:  Diagnosis Date   Dyspnea    Leukopenia    Neutropenia     ALLERGIES:  is allergic to food.  MEDICATIONS:  Current Outpatient Medications  Medication Sig Dispense Refill   azelastine (ASTELIN) 0.1 % nasal spray Place 2 sprays into both nostrils as needed for rhinitis. Use in each nostril as directed     cevimeline (EVOXAC) 30 MG capsule Take 30 mg by mouth daily. Alternate with pilocarpine     cyclobenzaprine (FLEXERIL) 10 MG tablet Take 10 mg by mouth as needed.     fluticasone (FLONASE) 50 MCG/ACT nasal spray Place 1 spray into both nostrils as needed for allergies or rhinitis.     meloxicam (MOBIC) 15 MG tablet Take  15 mg by mouth daily.     pilocarpine (SALAGEN) 5 MG tablet Take 5 mg by mouth daily.     potassium chloride  SA (KLOR-CON  M) 20 MEQ tablet Take 1 tablet (20 mEq total) by mouth daily. 7 tablet 0   Probiotic CHEW Chew by mouth 2 (two) times daily. Seed DS-01     propranolol (INDERAL) 20 MG tablet Take 20 mg by mouth daily.     SUMAtriptan (IMITREX) 100 MG tablet Take 100 mg by mouth as needed for migraine. May repeat in 2 hours if headache persists or recurs.     No current facility-administered medications for this visit.   Facility-Administered Medications Ordered in Other Visits  Medication Dose Route Frequency Provider Last Rate Last Admin   0.9 %  sodium chloride  infusion (Manually program via Guardrails IV Fluids)  250 mL Intravenous Continuous Sherrod Sherrod, MD   Stopped at 02/12/24 1533    SURGICAL HISTORY:  Past Surgical History:  Procedure Laterality Date   NO PAST SURGERIES      REVIEW OF SYSTEMS:   Review of Systems  Constitutional: Negative for appetite change, chills, fatigue, fever and unexpected weight change.  HENT: Positive for nasal drainage. Positive for ear pressure bilaterally. Negative for mouth sores, nosebleeds, sore throat and trouble swallowing.   Eyes: Negative for eye problems and icterus.  Respiratory: Negative for cough, hemoptysis, shortness of breath and wheezing.   Cardiovascular: Negative for chest pain and leg swelling.  Gastrointestinal: Negative for abdominal pain, constipation,  diarrhea, nausea and vomiting.  Genitourinary: Negative for bladder incontinence, difficulty urinating, dysuria, frequency and hematuria.   Musculoskeletal: Negative for back pain, gait problem, neck pain and neck stiffness.  Skin: Negative for itching and rash.  Neurological: Negative for dizziness, extremity weakness, gait problem, headaches, light-headedness and seizures.  Hematological: Negative for adenopathy. Does not bruise/bleed easily.  Psychiatric/Behavioral:  Negative for confusion, depression and sleep disturbance. The patient is not nervous/anxious.     PHYSICAL EXAMINATION:  There were no vitals taken for this visit.  ECOG PERFORMANCE STATUS: 1  Physical Exam  Constitutional: Oriented to person, place, and time and well-developed, well-nourished, and in no distress.  HENT:  Head: Normocephalic and atraumatic.  Ears: External ear and EAC without erythema, lesions, or swelling. Tms clear bilaterally with intact bony landmarks. No erythema, bulging, effusion.    Eyes: Conjunctivae are normal. Right eye exhibits no discharge. Left eye exhibits no discharge. No scleral icterus.  Neck: Normal range of motion. Neck supple.  Musculoskeletal: Normal range of motion. Exhibits no edema.  Lymphadenopathy:    No cervical adenopathy.  Neurological: Alert and oriented to person, place, and time. Exhibits normal muscle tone. Gait normal. Coordination normal.  Skin: Skin is warm and dry. No rash noted. Not diaphoretic. No erythema. No pallor.  Psychiatric: Mood, memory and judgment normal.  Vitals reviewed.  LABORATORY DATA: Lab Results  Component Value Date   WBC 1.3 (L) 02/12/2024   HGB 9.0 (L) 02/12/2024   HCT 25.4 (L) 02/12/2024   MCV 91.7 02/12/2024   PLT <5 (LL) 02/12/2024      Chemistry      Component Value Date/Time   NA 134 (L) 02/12/2024 0800   K 3.1 (L) 02/12/2024 0800   CL 96 (L) 02/12/2024 0800   CO2 23 02/12/2024 0800   BUN 19 02/12/2024 0800   CREATININE 0.84 02/12/2024 0800      Component Value Date/Time   CALCIUM 9.3 02/12/2024 0800   ALKPHOS 71 02/12/2024 0800   AST 15 02/12/2024 0800   ALT 13 02/12/2024 0800   BILITOT 0.8 02/12/2024 0800       RADIOGRAPHIC STUDIES:  No results found.   ASSESSMENT/PLAN:  Plan:  Thrombocytopenia Critically low platelet count (<5,000) poses significant bleeding risk. - Checked platelet count and scheduled repeat on Thursday. - If platelet count remains critically low on  Thursday, administered platelet transfusion as indicated. - If platelet count is still very low on Thursday, maintained Friday appointment for further evaluation and repeat platelet count. - Instructed her to seek emergency care for abnormal bleeding, including epistaxis, gum bleeding, hemoptysis, or hematemesis.  Neutropenia Ongoing neutropenia likely due to chemotherapy and clinical trial medication. Growth factor injection given last week; neutrophil count has not rebounded, maintaining elevated infection risk. - Monitored for signs of infection, including fever, and instructed her to seek prompt evaluation if these occur. - She is on antibiotic prophylaxis.   Acute Eustachian tube dysfunction with questionable possible mild serous otitis media -Acute bilateral ear fullness and pressure, worse on the right, without pain or overt infection. Likely Eustachian tube dysfunction. On levofloxacin for bacterial coverage. - Continued current antihistamine (loratadine). - Initiated intranasal corticosteroid (fluticasone). - Initiated oral decongestant (pseudoephedrine), provided no contraindications such as hypertension. - Encouraged Eustachian tube exercises (Valsalva maneuver). - Instructed to monitor for new or worsening symptoms, including fever, facial pain, or increased pressure, and seek prompt evaluation if these occur.  Hypokalemia -Potassium p.o sent to pharmacy today    No orders  of the defined types were placed in this encounter.   The total time spent in the appointment was 20-29 minutes  Alianis Trimmer L Avrie Kedzierski, PA-C 02/12/24

## 2024-02-12 NOTE — Telephone Encounter (Signed)
 Dr Nanda office faxed labs drawn at the cancer center for today. CBC, Cmp and Mag entered into flowsheet.

## 2024-02-12 NOTE — Telephone Encounter (Signed)
 Day 12 of #1 cons with HiDAC (1.5gm/m2) IV q12h x 6 doses plus Selinexor PO daily on days 1, 3, 5, 8, 11 and 15.  Received Udenyca  support locally on 02/07/24.  Pancytopenic as expected.  Receiving transfusion support locally and continues ppx abx therapy.  See other phone note 02/12/24.  Electronically signed by: Duwaine KATHEE Lot, NP 02/12/2024 12:17 PM

## 2024-02-12 NOTE — Patient Instructions (Signed)
 Thrombocytopenia Thrombocytopenia means that you have a low number of platelets in your blood. Platelets are tiny cells in the blood. When you bleed, they clump together at the cut or injury to stop the bleeding. This is called blood clotting. If you do not have enough platelets, your blood may have trouble clotting. This may cause you to bleed and bruise very easily. What are the causes? This condition is caused by a low number of platelets in your blood. There are three main reasons for this: Your body not making enough platelets. This may be caused by: Bone marrow diseases. Disorders that are passed from parent to child (inherited). Certain cancer medicines or treatments. Infection from germs (bacteria or viruses). Alcoholism. Platelets not being released in the blood. This can be caused by: Having a spleen that is larger than normal. A condition called Gaucher disease. Your body destroying platelets too quickly. This may be caused by: Certain autoimmune diseases. Some medicines that thin your blood. Certain blood clotting disorders. Certain bleeding disorders. Exposure to harmful (toxic) chemicals. Pregnancy. What are the signs or symptoms? Bruising easily. Bleeding from the nose or mouth. Heavy menstrual periods. Blood in the pee (urine), poop (stool), or vomit. A purple-red color to the skin (purpura). A rash that looks like pinpoint, purple-red spots (petechiae) on the lower legs. How is this treated? Treatment depends on the cause. Treatment may include: Treatment of another condition that is causing the low platelet count. Medicines to help protect your platelets from being destroyed. A replacement (transfusion) of platelets to stop or prevent bleeding. Surgery to take out the spleen. Follow these instructions at home: Medicines Take over-the-counter and prescription medicines only as told by your doctor. Do not take any medicines that have aspirin or NSAIDs, such as  ibuprofen. Activity Avoid doing things that could hurt or bruise you. Take action to prevent falls. Do not play contact sports. Ask your doctor what activities are safe for you. Take care not to burn yourself: When you use an iron. When you cook. Take care not to cut yourself: When you shave. When you use scissors, needles, knives, or other tools. General instructions  Check your skin and the inside of your mouth for bruises or blood as told by your doctor. Wear a medical alert bracelet that says that you have a bleeding disorder. Check to see if there is blood in your pee and poop. Do this as told by your doctor. Do not drink alcohol. If you do drink, limit the amount that you drink. Stay away from harmful (toxic) chemicals. Tell all of your doctors that you have this condition. Be sure to tell your dentist and eye doctor. Tell your dentist about your condition before you have your teeth cleaned. Keep all follow-up visits. Contact a doctor if: You have bruises and you do not know why. You have new symptoms. You have symptoms that get worse. You have a fever. Get help right away if: You have very bad bleeding anywhere on your body. You have blood in your vomit, pee, or poop. You have an injury to your head. You have a sudden, very bad headache. Summary Thrombocytopenia means that you have a low number of platelets in your blood. Platelets stick together to form a clot. Symptoms of this condition include getting bruises easily, bleeding from the mouth and nose, a purple-red color to the skin, and a rash. Take care not to cut or burn yourself. This information is not intended to replace advice given  to you by your health care provider. Make sure you discuss any questions you have with your health care provider. Document Revised: 06/24/2020 Document Reviewed: 06/24/2020 Elsevier Patient Education  2024 [employer].

## 2024-02-12 NOTE — Telephone Encounter (Signed)
 Spoke with patient this morning regarding recent lab results. Per Dr. Sherrod, patients platelet count is low, and transfusion of 1 unit of platelets is indicated. Patient was offered an appointment today at 1330 for platelet transfusion, which she accepted.  Bleeding precautions were reviewed with the patient. Patient reported that this morning, upon blowing her nose, she noticed a small amount of bright red clots approximately the size of a pea on the tissue. She also reported increased shortness of breath over the past two days. Patient was instructed to closely monitor symptoms and to seek evaluation in the emergency department if bleeding worsens or shortness of breath increases.  Spoke with Mallie at [facility] regarding the patients plan of care and clarification of responsibility for managing laboratory results. Discussed todays lab results, including potassium level of 3.1. Mallie contacted Megan during the call and confirmed that potassium management should be handled by our office per protocol. Cassie, PA, has sent a potassium prescription to the patients pharmacy.  Lab results were faxed to [facility] with confirmation at [phone].

## 2024-02-13 LAB — PREPARE PLATELET PHERESIS: Unit division: 0

## 2024-02-13 LAB — BPAM PLATELET PHERESIS
Blood Product Expiration Date: 202601222359
ISSUE DATE / TIME: 202601201430
Unit Type and Rh: 7300

## 2024-02-14 ENCOUNTER — Encounter: Payer: Self-pay | Admitting: Physician Assistant

## 2024-02-14 ENCOUNTER — Other Ambulatory Visit: Payer: Self-pay | Admitting: Physician Assistant

## 2024-02-14 ENCOUNTER — Other Ambulatory Visit: Payer: Self-pay

## 2024-02-14 ENCOUNTER — Inpatient Hospital Stay

## 2024-02-14 ENCOUNTER — Telehealth: Payer: Self-pay

## 2024-02-14 DIAGNOSIS — C9200 Acute myeloblastic leukemia, not having achieved remission: Secondary | ICD-10-CM

## 2024-02-14 DIAGNOSIS — D696 Thrombocytopenia, unspecified: Secondary | ICD-10-CM

## 2024-02-14 LAB — CBC WITH DIFFERENTIAL (CANCER CENTER ONLY)
Abs Immature Granulocytes: 0 K/uL (ref 0.00–0.07)
Basophils Absolute: 0 K/uL (ref 0.0–0.1)
Basophils Relative: 1 %
Eosinophils Absolute: 0 K/uL (ref 0.0–0.5)
Eosinophils Relative: 1 %
HCT: 22.2 % — ABNORMAL LOW (ref 36.0–46.0)
Hemoglobin: 8.2 g/dL — ABNORMAL LOW (ref 12.0–15.0)
Immature Granulocytes: 0 %
Lymphocytes Relative: 36 %
Lymphs Abs: 0.7 K/uL (ref 0.7–4.0)
MCH: 33.7 pg (ref 26.0–34.0)
MCHC: 36.9 g/dL — ABNORMAL HIGH (ref 30.0–36.0)
MCV: 91.4 fL (ref 80.0–100.0)
Monocytes Absolute: 0.3 K/uL (ref 0.1–1.0)
Monocytes Relative: 16 %
Neutro Abs: 0.9 K/uL — ABNORMAL LOW (ref 1.7–7.7)
Neutrophils Relative %: 46 %
Platelet Count: <5 K/uL — CL (ref 150–400)
RBC: 2.43 MIL/uL — ABNORMAL LOW (ref 3.87–5.11)
RDW: 14.2 % (ref 11.5–15.5)
WBC Count: 2 K/uL — ABNORMAL LOW (ref 4.0–10.5)
nRBC: 0 % (ref 0.0–0.2)

## 2024-02-14 LAB — CMP (CANCER CENTER ONLY)
ALT: 13 U/L (ref 0–44)
AST: 16 U/L (ref 15–41)
Albumin: 4.4 g/dL (ref 3.5–5.0)
Alkaline Phosphatase: 66 U/L (ref 38–126)
Anion gap: 11 (ref 5–15)
BUN: 17 mg/dL (ref 6–20)
CO2: 26 mmol/L (ref 22–32)
Calcium: 9.5 mg/dL (ref 8.9–10.3)
Chloride: 98 mmol/L (ref 98–111)
Creatinine: 0.93 mg/dL (ref 0.44–1.00)
GFR, Estimated: >60 mL/min
Glucose, Bld: 100 mg/dL — ABNORMAL HIGH (ref 70–99)
Potassium: 4.2 mmol/L (ref 3.5–5.1)
Sodium: 135 mmol/L (ref 135–145)
Total Bilirubin: 0.7 mg/dL (ref 0.0–1.2)
Total Protein: 7 g/dL (ref 6.5–8.1)

## 2024-02-14 LAB — SAMPLE TO BLOOD BANK

## 2024-02-14 LAB — MAGNESIUM: Magnesium: 2.2 mg/dL (ref 1.7–2.4)

## 2024-02-14 LAB — PREPARE RBC (CROSSMATCH)

## 2024-02-14 MED ORDER — ACETAMINOPHEN 325 MG PO TABS
650.0000 mg | ORAL_TABLET | Freq: Once | ORAL | Status: AC
  Administered 2024-02-14: 650 mg via ORAL
  Filled 2024-02-14: qty 2

## 2024-02-14 MED ORDER — DIPHENHYDRAMINE HCL 25 MG PO CAPS
25.0000 mg | ORAL_CAPSULE | Freq: Once | ORAL | Status: AC
  Administered 2024-02-14: 25 mg via ORAL
  Filled 2024-02-14: qty 1

## 2024-02-14 MED ORDER — SODIUM CHLORIDE 0.9% IV SOLUTION
250.0000 mL | Freq: Once | INTRAVENOUS | Status: AC
  Administered 2024-02-14: 250 mL via INTRAVENOUS

## 2024-02-14 NOTE — Patient Instructions (Signed)
 Blood Transfusion, Adult A blood transfusion is a procedure in which you receive blood through an IV tube. You may need this procedure because of: A bleeding disorder. An illness. An injury. A surgery. The blood may come from someone else (a donor). You may also be able to donate blood for yourself before a surgery. The blood given in a transfusion may be made up of different types of cells. You may get: Red blood cells. These carry oxygen to the cells in the body. Platelets. These help your blood to clot. Plasma. This is the liquid part of your blood. It carries proteins and other substances through the body. White blood cells. These help you fight infections. If you have a clotting disorder, you may also get other types of blood products. Depending on the type of blood product, this procedure may take 1-4 hours to complete. Tell your doctor about: Any bleeding problems you have. Any reactions you have had during a blood transfusion in the past. Any allergies you have. All medicines you are taking, including vitamins, herbs, eye drops, creams, and over-the-counter medicines. Any surgeries you have had. Any medical conditions you have. Whether you are pregnant or may be pregnant. What are the risks? Talk with your health care provider about risks. The most common problems include: A mild allergic reaction. This includes red, swollen areas of skin (hives) and itching. Fever or chills. This may be the body's response to new blood cells received. This may happen during or up to 4 hours after the transfusion. More serious problems may include: A serious allergic reaction. This includes breathing trouble or swelling around the face and lips. Too much fluid in the lungs. This may cause breathing problems. Lung injury. This causes breathing trouble and low oxygen in the blood. This can happen within hours of the transfusion or days later. Too much iron . This can happen after getting many blood  transfusions over a period of time. An infection or virus passed through the blood. This is rare. Donated blood is carefully tested before it is given. Your body's defense system (immune system) trying to attack the new blood cells. This is rare. Symptoms may include fever, chills, nausea, low blood pressure, and low back or chest pain. Donated cells attacking healthy tissues. This is rare. What happens before the procedure? You will have a blood test to find out your blood type. The test also finds out what type of blood your body will accept and matches it to the donor type. If you are going to have a planned surgery, you may be able to donate your own blood. This may be done in case you need a transfusion. You will have your temperature, blood pressure, and pulse checked. You may receive medicine to help prevent an allergic reaction. This may be done if you have had a reaction to a transfusion before. This medicine may be given to you by mouth or through an IV tube. What happens during the procedure?  An IV tube will be put into one of your veins. The bag of blood will be attached to your IV tube. Then, the blood will enter through your vein. Your temperature, blood pressure, and pulse will be checked often. This is done to find early signs of a transfusion reaction. Tell your nurse right away if you have any of these symptoms: Shortness of breath or trouble breathing. Chest or back pain. Fever or chills. Red, swollen areas of skin or itching. If you have any signs  or symptoms of a reaction, your transfusion will be stopped. You may also be given medicine. When the transfusion is finished, your IV tube will be taken out. Pressure may be put on the IV site for a few minutes. A bandage (dressing) will be put on the IV site. The procedure may vary among doctors and hospitals. What happens after the procedure? You will be monitored until you leave the hospital or clinic. This includes  checking your temperature, blood pressure, pulse, breathing rate, and blood oxygen level. Your blood may be tested to see how you have responded to the transfusion. You may be warmed with fluids or blankets. This is done to keep the temperature of your body normal. If you have your procedure in an outpatient setting, you will be told whom to contact to report any reactions. Where to find more information Visit the [employer]: redcross.org Summary A blood transfusion is a procedure in which you receive blood through an IV tube. The blood you are given may be made up of different blood cells. You may receive red blood cells, platelets, plasma, or white blood cells. Your temperature, blood pressure, and pulse will be checked often. After the procedure, your blood may be tested to see how you have responded. This information is not intended to replace advice given to you by your health care provider. Make sure you discuss any questions you have with your health care provider. Document Revised: 04/08/2021 Document Reviewed: 04/08/2021 Elsevier Patient Education  2024 [employer].
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
## 2024-02-14 NOTE — Telephone Encounter (Signed)
 Spoke with Chiquita from [facility] regarding patients care. She advised to contact [facility] with lab results, at which time a plan of care will be determined based on results.   Spoke with charge nurse and rescheduled patients appointments for tomorrow, with labs at 11:00 AM and a hold for 1 unit of platelets and 1 unit of blood at 12:00 PM. Spoke with Christy from [facility] and informed her of the appt time changes.   Spoke with patient to inform her of the appointment changes. Patient voiced understanding.

## 2024-02-14 NOTE — Progress Notes (Signed)
 Spoke with [place]  in the blood bank and confirmed platelet orders.

## 2024-02-14 NOTE — Progress Notes (Signed)
 CRITICAL VALUE STICKER  CRITICAL VALUE: platelets less than 5   RECEIVER (on-site recipient of call): Rosina  DATE & TIME NOTIFIED: 02/14/24 @ 1:08 PM  MESSENGER (representative from lab): Almarie   MD NOTIFIED: Cassie, PA  TIME OF NOTIFICATION: 1:10 PM  RESPONSE: patient receiving 1 unit of platelets today

## 2024-02-15 ENCOUNTER — Inpatient Hospital Stay

## 2024-02-15 ENCOUNTER — Other Ambulatory Visit: Payer: Self-pay | Admitting: Physician Assistant

## 2024-02-15 ENCOUNTER — Telehealth: Payer: Self-pay

## 2024-02-15 ENCOUNTER — Other Ambulatory Visit: Payer: Self-pay

## 2024-02-15 DIAGNOSIS — C9200 Acute myeloblastic leukemia, not having achieved remission: Secondary | ICD-10-CM

## 2024-02-15 DIAGNOSIS — C9201 Acute myeloblastic leukemia, in remission: Secondary | ICD-10-CM

## 2024-02-15 DIAGNOSIS — D696 Thrombocytopenia, unspecified: Secondary | ICD-10-CM

## 2024-02-15 LAB — BPAM PLATELET PHERESIS
Blood Product Expiration Date: 202601262359
ISSUE DATE / TIME: 202601221406
Unit Type and Rh: 6200

## 2024-02-15 LAB — CBC WITH DIFFERENTIAL (CANCER CENTER ONLY)
Abs Immature Granulocytes: 0.04 K/uL (ref 0.00–0.07)
Basophils Absolute: 0 K/uL (ref 0.0–0.1)
Basophils Relative: 0 %
Eosinophils Absolute: 0 K/uL (ref 0.0–0.5)
Eosinophils Relative: 0 %
HCT: 21.2 % — ABNORMAL LOW (ref 36.0–46.0)
Hemoglobin: 7.7 g/dL — ABNORMAL LOW (ref 12.0–15.0)
Immature Granulocytes: 1 %
Lymphocytes Relative: 20 %
Lymphs Abs: 0.6 K/uL — ABNORMAL LOW (ref 0.7–4.0)
MCH: 33.2 pg (ref 26.0–34.0)
MCHC: 36.3 g/dL — ABNORMAL HIGH (ref 30.0–36.0)
MCV: 91.4 fL (ref 80.0–100.0)
Monocytes Absolute: 0.6 K/uL (ref 0.1–1.0)
Monocytes Relative: 17 %
Neutro Abs: 2 K/uL (ref 1.7–7.7)
Neutrophils Relative %: 62 %
Platelet Count: 9 K/uL — CL (ref 150–400)
RBC: 2.32 MIL/uL — ABNORMAL LOW (ref 3.87–5.11)
RDW: 14.3 % (ref 11.5–15.5)
WBC Count: 3.3 K/uL — ABNORMAL LOW (ref 4.0–10.5)
nRBC: 0 % (ref 0.0–0.2)

## 2024-02-15 LAB — CMP (CANCER CENTER ONLY)
ALT: 13 U/L (ref 0–44)
AST: 17 U/L (ref 15–41)
Albumin: 4.3 g/dL (ref 3.5–5.0)
Alkaline Phosphatase: 68 U/L (ref 38–126)
Anion gap: 11 (ref 5–15)
BUN: 14 mg/dL (ref 6–20)
CO2: 26 mmol/L (ref 22–32)
Calcium: 9.3 mg/dL (ref 8.9–10.3)
Chloride: 100 mmol/L (ref 98–111)
Creatinine: 0.81 mg/dL (ref 0.44–1.00)
GFR, Estimated: >60 mL/min
Glucose, Bld: 100 mg/dL — ABNORMAL HIGH (ref 70–99)
Potassium: 4.3 mmol/L (ref 3.5–5.1)
Sodium: 137 mmol/L (ref 135–145)
Total Bilirubin: 0.5 mg/dL (ref 0.0–1.2)
Total Protein: 6.9 g/dL (ref 6.5–8.1)

## 2024-02-15 LAB — PREPARE PLATELET PHERESIS: Unit division: 0

## 2024-02-15 LAB — TYPE AND SCREEN
ABO/RH(D): O POS
Antibody Screen: NEGATIVE

## 2024-02-15 LAB — SAMPLE TO BLOOD BANK

## 2024-02-15 LAB — MAGNESIUM: Magnesium: 2.2 mg/dL (ref 1.7–2.4)

## 2024-02-15 MED ORDER — SODIUM CHLORIDE 0.9% IV SOLUTION: 250.0000 mL | INTRAVENOUS | Status: DC

## 2024-02-15 MED ORDER — DIPHENHYDRAMINE HCL 25 MG PO CAPS: 25.0000 mg | ORAL_CAPSULE | Freq: Once | ORAL | Status: DC

## 2024-02-15 MED ORDER — ACETAMINOPHEN 325 MG PO TABS: 650.0000 mg | ORAL_TABLET | Freq: Once | ORAL | Status: DC

## 2024-02-15 NOTE — Progress Notes (Signed)
 Received order for 1 unit platelet for 02-16-24 Blood bank verified they received order.

## 2024-02-15 NOTE — Patient Instructions (Signed)
 Blood Transfusion, Adult A blood transfusion is a procedure in which you receive blood through an IV tube. You may need this procedure because of: A bleeding disorder. An illness. An injury. A surgery. The blood may come from someone else (a donor). You may also be able to donate blood for yourself before a surgery. The blood given in a transfusion may be made up of different types of cells. You may get: Red blood cells. These carry oxygen to the cells in the body. Platelets. These help your blood to clot. Plasma. This is the liquid part of your blood. It carries proteins and other substances through the body. White blood cells. These help you fight infections. If you have a clotting disorder, you may also get other types of blood products. Depending on the type of blood product, this procedure may take 1-4 hours to complete. Tell your doctor about: Any bleeding problems you have. Any reactions you have had during a blood transfusion in the past. Any allergies you have. All medicines you are taking, including vitamins, herbs, eye drops, creams, and over-the-counter medicines. Any surgeries you have had. Any medical conditions you have. Whether you are pregnant or may be pregnant. What are the risks? Talk with your health care provider about risks. The most common problems include: A mild allergic reaction. This includes red, swollen areas of skin (hives) and itching. Fever or chills. This may be the body's response to new blood cells received. This may happen during or up to 4 hours after the transfusion. More serious problems may include: A serious allergic reaction. This includes breathing trouble or swelling around the face and lips. Too much fluid in the lungs. This may cause breathing problems. Lung injury. This causes breathing trouble and low oxygen in the blood. This can happen within hours of the transfusion or days later. Too much iron . This can happen after getting many blood  transfusions over a period of time. An infection or virus passed through the blood. This is rare. Donated blood is carefully tested before it is given. Your body's defense system (immune system) trying to attack the new blood cells. This is rare. Symptoms may include fever, chills, nausea, low blood pressure, and low back or chest pain. Donated cells attacking healthy tissues. This is rare. What happens before the procedure? You will have a blood test to find out your blood type. The test also finds out what type of blood your body will accept and matches it to the donor type. If you are going to have a planned surgery, you may be able to donate your own blood. This may be done in case you need a transfusion. You will have your temperature, blood pressure, and pulse checked. You may receive medicine to help prevent an allergic reaction. This may be done if you have had a reaction to a transfusion before. This medicine may be given to you by mouth or through an IV tube. What happens during the procedure?  An IV tube will be put into one of your veins. The bag of blood will be attached to your IV tube. Then, the blood will enter through your vein. Your temperature, blood pressure, and pulse will be checked often. This is done to find early signs of a transfusion reaction. Tell your nurse right away if you have any of these symptoms: Shortness of breath or trouble breathing. Chest or back pain. Fever or chills. Red, swollen areas of skin or itching. If you have any signs  or symptoms of a reaction, your transfusion will be stopped. You may also be given medicine. When the transfusion is finished, your IV tube will be taken out. Pressure may be put on the IV site for a few minutes. A bandage (dressing) will be put on the IV site. The procedure may vary among doctors and hospitals. What happens after the procedure? You will be monitored until you leave the hospital or clinic. This includes  checking your temperature, blood pressure, pulse, breathing rate, and blood oxygen level. Your blood may be tested to see how you have responded to the transfusion. You may be warmed with fluids or blankets. This is done to keep the temperature of your body normal. If you have your procedure in an outpatient setting, you will be told whom to contact to report any reactions. Where to find more information Visit the [employer]: redcross.org Summary A blood transfusion is a procedure in which you receive blood through an IV tube. The blood you are given may be made up of different blood cells. You may receive red blood cells, platelets, plasma, or white blood cells. Your temperature, blood pressure, and pulse will be checked often. After the procedure, your blood may be tested to see how you have responded. This information is not intended to replace advice given to you by your health care provider. Make sure you discuss any questions you have with your health care provider. Document Revised: 04/08/2021 Document Reviewed: 04/08/2021 Elsevier Patient Education  2024 [employer].

## 2024-02-15 NOTE — Telephone Encounter (Signed)
 Faxed most recent lab results to [facility] with fax confirmation received at [phone].  Spoke with Marval at [facility] and informed her that the patient is currently receiving 1 unit of platelets and 1 unit of blood cells, with the possibility of a second unit of blood today. If the patient does not receive the second unit today, she will receive 1 unit of blood tomorrow. Debbie verbalized understanding.

## 2024-02-15 NOTE — Telephone Encounter (Signed)
 CRITICAL VALUE STICKER  CRITICAL VALUE:hbg 7.7, plt 9  RECEIVER (on-site recipient of call):Jahleel Stroschein, RN  DATE & TIME NOTIFIED: 11:58 AM 02/15/2024  MESSENGER (representative from lab):lab   MD NOTIFIED: Cassie, PA  TIME OF NOTIFICATION:11:59 AM  RESPONSE:  2 units of PRBC and 1 unit platelet order per Cassie, PA

## 2024-02-16 ENCOUNTER — Inpatient Hospital Stay

## 2024-02-16 DIAGNOSIS — C9200 Acute myeloblastic leukemia, not having achieved remission: Secondary | ICD-10-CM | POA: Diagnosis not present

## 2024-02-16 MED ORDER — SODIUM CHLORIDE 0.9% IV SOLUTION
250.0000 mL | INTRAVENOUS | Status: DC
  Administered 2024-02-16: 100 mL via INTRAVENOUS

## 2024-02-16 NOTE — Patient Instructions (Signed)
 Low Number of Platelets (Thrombocytopenia): What to Know  Thrombocytopenia means that you have a low number of platelets. Platelets are cells in your blood. When you bleed, they clump together as a clot to stop the bleeding. If you don't have enough platelets, your blood may have trouble clotting. This may cause you to bleed inside your body. What are the causes? Having a low number of platelets in your blood may happen if: Your body doesn't make enough platelets. This may be caused by: Anemia. Leukemia. Other bone marrow diseases. Your platelets don't release into your blood. This can happen if: You have a spleen that's too large. You have Gaucher disease. Your body breaks down platelets too fast. This may be caused by: An autoimmune disease. This is a problem that affects your body's defense (immune) system. Certain medicines that thin your blood. Being exposed to harmful (toxic) chemicals. Pregnancy. You have a disease that's inherited. This means it's passed from parent to child. You've had certain cancer medicines or treatments. You have an infection from germs, such as bacteria or viruses. You drink too much alcohol. What are the signs or symptoms? Bruising easy. Bleeding from your nose or mouth. Heavy periods in females. Blood in your pee, poop, or throw-up. A purple-red color to your skin. Having very small red spots on your skin that look like a rash. How is this diagnosed? You may be diagnosed with a physical exam. You may also have tests, such as: Blood tests. A biopsy. This is when a small piece of bone marrow is removed for testing. An ultrasound or CT scan of your belly. How is this treated? Treatment may include: Treating the problem that's causing the low number of platelets. Taking medicines to stop your body from breaking down platelets too fast. Being given platelets through a transfusion. Having surgery to remove your spleen. Follow these instructions at  home: Medicines Take your medicines only as told. Do not take aspirin or ibuprofen unless you're told to. These medicines can make you more likely to bleed. Activity Do not do things that could hurt or bruise you. Be careful to avoid falls. Do not play contact sports. Ask what things are safe for you to do at home. Take care not to cut yourself: When you shave. When you use scissors or other sharp tools. General instructions  Check your skin and the inside of your mouth for bruises or blood. Check your pee and poop for blood. Do not drink alcohol if your health care provider tells you not to drink. Stay away from harmful chemicals. Tell all of your providers that you have a low number of platelets. Tell your dental care provider before you have any dental work or cleanings. Wear an alert bracelet or carry a card that says you may bleed easy. Contact a health care provider if: You have bruises that you can't explain. You have any bleeding. You have blood in your: Throw-up. Pee. Poop. You have a fever. Get help right away if: You have bleeding that you can't stop. You hit your head. You have a sudden, very bad headache. This information is not intended to replace advice given to you by your health care provider. Make sure you discuss any questions you have with your health care provider. Document Revised: 03/06/2023 Document Reviewed: 03/06/2023 Elsevier Patient Education  2025 [employer].

## 2024-02-18 ENCOUNTER — Inpatient Hospital Stay

## 2024-02-18 LAB — BPAM PLATELET PHERESIS
Blood Product Expiration Date: 202601252359
Blood Product Expiration Date: 202601262359
ISSUE DATE / TIME: 202601231612
ISSUE DATE / TIME: 202601240755
Unit Type and Rh: 6200
Unit Type and Rh: 6200

## 2024-02-18 LAB — PREPARE PLATELET PHERESIS
Unit division: 0
Unit division: 0

## 2024-02-18 LAB — BPAM RBC
Blood Product Expiration Date: 202602172359
Blood Product Expiration Date: 202602172359
ISSUE DATE / TIME: 202601231224
ISSUE DATE / TIME: 202601231424
Unit Type and Rh: 5100
Unit Type and Rh: 5100

## 2024-02-18 LAB — TYPE AND SCREEN
ABO/RH(D): O POS
Antibody Screen: NEGATIVE
Unit division: 0
Unit division: 0

## 2024-02-19 ENCOUNTER — Inpatient Hospital Stay

## 2024-02-19 DIAGNOSIS — C9200 Acute myeloblastic leukemia, not having achieved remission: Secondary | ICD-10-CM | POA: Diagnosis not present

## 2024-02-19 LAB — CMP (CANCER CENTER ONLY)
ALT: 17 U/L (ref 0–44)
AST: 20 U/L (ref 15–41)
Albumin: 4.5 g/dL (ref 3.5–5.0)
Alkaline Phosphatase: 68 U/L (ref 38–126)
Anion gap: 11 (ref 5–15)
BUN: 16 mg/dL (ref 6–20)
CO2: 25 mmol/L (ref 22–32)
Calcium: 9.3 mg/dL (ref 8.9–10.3)
Chloride: 98 mmol/L (ref 98–111)
Creatinine: 0.76 mg/dL (ref 0.44–1.00)
GFR, Estimated: >60 mL/min
Glucose, Bld: 148 mg/dL — ABNORMAL HIGH (ref 70–99)
Potassium: 4 mmol/L (ref 3.5–5.1)
Sodium: 134 mmol/L — ABNORMAL LOW (ref 135–145)
Total Bilirubin: 0.4 mg/dL (ref 0.0–1.2)
Total Protein: 7.1 g/dL (ref 6.5–8.1)

## 2024-02-19 LAB — CBC WITH DIFFERENTIAL (CANCER CENTER ONLY)
Abs Immature Granulocytes: 0 10*3/uL (ref 0.00–0.07)
Basophils Absolute: 0 10*3/uL (ref 0.0–0.1)
Basophils Relative: 0 %
Eosinophils Absolute: 0 10*3/uL (ref 0.0–0.5)
Eosinophils Relative: 0 %
HCT: 28.3 % — ABNORMAL LOW (ref 36.0–46.0)
Hemoglobin: 10 g/dL — ABNORMAL LOW (ref 12.0–15.0)
Immature Granulocytes: 0 %
Lymphocytes Relative: 26 %
Lymphs Abs: 0.5 10*3/uL — ABNORMAL LOW (ref 0.7–4.0)
MCH: 31.3 pg (ref 26.0–34.0)
MCHC: 35.3 g/dL (ref 30.0–36.0)
MCV: 88.4 fL (ref 80.0–100.0)
Monocytes Absolute: 0.2 10*3/uL (ref 0.1–1.0)
Monocytes Relative: 11 %
Neutro Abs: 1.3 10*3/uL — ABNORMAL LOW (ref 1.7–7.7)
Neutrophils Relative %: 63 %
Platelet Count: 30 10*3/uL — ABNORMAL LOW (ref 150–400)
RBC: 3.2 MIL/uL — ABNORMAL LOW (ref 3.87–5.11)
RDW: 15.2 % (ref 11.5–15.5)
WBC Count: 2 10*3/uL — ABNORMAL LOW (ref 4.0–10.5)
nRBC: 0 % (ref 0.0–0.2)

## 2024-02-19 LAB — MAGNESIUM: Magnesium: 2.2 mg/dL (ref 1.7–2.4)

## 2024-02-19 LAB — SAMPLE TO BLOOD BANK

## 2024-02-20 ENCOUNTER — Telehealth: Payer: Self-pay

## 2024-02-20 NOTE — Telephone Encounter (Signed)
 Received outside labs from [facility] for 02/19/24 CDP, CMP and mag; results entered. Please review, thanks.

## 2024-02-20 NOTE — Telephone Encounter (Signed)
 Faxed lab results to [facility] with confirmation at [phone].

## 2024-02-20 NOTE — Telephone Encounter (Signed)
 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 Received Udenyca  support locally on 02/07/24.  Counts improving.  Will stop ppx abx therapy.  No transfusions needed.  Called and alerted patient who verbalized understanding.  To have repeat labs locally tomorrow.  Electronically signed by: Duwaine KATHEE Lot, NP 02/20/2024 12:13 PM

## 2024-02-21 ENCOUNTER — Inpatient Hospital Stay

## 2024-02-21 DIAGNOSIS — C9200 Acute myeloblastic leukemia, not having achieved remission: Secondary | ICD-10-CM | POA: Diagnosis not present

## 2024-02-21 LAB — CBC WITH DIFFERENTIAL (CANCER CENTER ONLY)
Abs Immature Granulocytes: 0.01 10*3/uL (ref 0.00–0.07)
Basophils Absolute: 0 10*3/uL (ref 0.0–0.1)
Basophils Relative: 0 %
Eosinophils Absolute: 0 10*3/uL (ref 0.0–0.5)
Eosinophils Relative: 0 %
HCT: 28.7 % — ABNORMAL LOW (ref 36.0–46.0)
Hemoglobin: 10.4 g/dL — ABNORMAL LOW (ref 12.0–15.0)
Immature Granulocytes: 0 %
Lymphocytes Relative: 23 %
Lymphs Abs: 0.6 10*3/uL — ABNORMAL LOW (ref 0.7–4.0)
MCH: 32.2 pg (ref 26.0–34.0)
MCHC: 36.2 g/dL — ABNORMAL HIGH (ref 30.0–36.0)
MCV: 88.9 fL (ref 80.0–100.0)
Monocytes Absolute: 0.3 10*3/uL (ref 0.1–1.0)
Monocytes Relative: 11 %
Neutro Abs: 1.7 10*3/uL (ref 1.7–7.7)
Neutrophils Relative %: 66 %
Platelet Count: 50 10*3/uL — ABNORMAL LOW (ref 150–400)
RBC: 3.23 MIL/uL — ABNORMAL LOW (ref 3.87–5.11)
RDW: 15 % (ref 11.5–15.5)
WBC Count: 2.6 10*3/uL — ABNORMAL LOW (ref 4.0–10.5)
nRBC: 0 % (ref 0.0–0.2)

## 2024-02-21 LAB — CMP (CANCER CENTER ONLY)
ALT: 16 U/L (ref 0–44)
AST: 19 U/L (ref 15–41)
Albumin: 4.6 g/dL (ref 3.5–5.0)
Alkaline Phosphatase: 67 U/L (ref 38–126)
Anion gap: 11 (ref 5–15)
BUN: 11 mg/dL (ref 6–20)
CO2: 26 mmol/L (ref 22–32)
Calcium: 9.6 mg/dL (ref 8.9–10.3)
Chloride: 100 mmol/L (ref 98–111)
Creatinine: 0.93 mg/dL (ref 0.44–1.00)
GFR, Estimated: >60 mL/min
Glucose, Bld: 93 mg/dL (ref 70–99)
Potassium: 4.1 mmol/L (ref 3.5–5.1)
Sodium: 137 mmol/L (ref 135–145)
Total Bilirubin: 0.5 mg/dL (ref 0.0–1.2)
Total Protein: 7.2 g/dL (ref 6.5–8.1)

## 2024-02-21 LAB — MAGNESIUM: Magnesium: 2.2 mg/dL (ref 1.7–2.4)

## 2024-02-21 LAB — SAMPLE TO BLOOD BANK

## 2024-02-22 ENCOUNTER — Inpatient Hospital Stay

## 2024-02-22 ENCOUNTER — Telehealth: Payer: Self-pay | Admitting: Internal Medicine

## 2024-02-25 ENCOUNTER — Inpatient Hospital Stay

## 2024-02-26 ENCOUNTER — Inpatient Hospital Stay

## 2024-02-26 ENCOUNTER — Telehealth: Payer: Self-pay

## 2024-02-26 DIAGNOSIS — C9200 Acute myeloblastic leukemia, not having achieved remission: Secondary | ICD-10-CM

## 2024-02-26 LAB — CBC WITH DIFFERENTIAL (CANCER CENTER ONLY)
Abs Immature Granulocytes: 0.01 10*3/uL (ref 0.00–0.07)
Basophils Absolute: 0 10*3/uL (ref 0.0–0.1)
Basophils Relative: 0 %
Eosinophils Absolute: 0 10*3/uL (ref 0.0–0.5)
Eosinophils Relative: 0 %
HCT: 26.7 % — ABNORMAL LOW (ref 36.0–46.0)
Hemoglobin: 9.3 g/dL — ABNORMAL LOW (ref 12.0–15.0)
Immature Granulocytes: 1 %
Lymphocytes Relative: 23 %
Lymphs Abs: 0.5 10*3/uL — ABNORMAL LOW (ref 0.7–4.0)
MCH: 32.2 pg (ref 26.0–34.0)
MCHC: 34.8 g/dL (ref 30.0–36.0)
MCV: 92.4 fL (ref 80.0–100.0)
Monocytes Absolute: 0.5 10*3/uL (ref 0.1–1.0)
Monocytes Relative: 22 %
Neutro Abs: 1.2 10*3/uL — ABNORMAL LOW (ref 1.7–7.7)
Neutrophils Relative %: 54 %
Platelet Count: 105 10*3/uL — ABNORMAL LOW (ref 150–400)
RBC: 2.89 MIL/uL — ABNORMAL LOW (ref 3.87–5.11)
RDW: 16.3 % — ABNORMAL HIGH (ref 11.5–15.5)
WBC Count: 2.2 10*3/uL — ABNORMAL LOW (ref 4.0–10.5)
nRBC: 0 % (ref 0.0–0.2)

## 2024-02-26 LAB — CMP (CANCER CENTER ONLY)
ALT: 19 U/L (ref 0–44)
AST: 21 U/L (ref 15–41)
Albumin: 4.4 g/dL (ref 3.5–5.0)
Alkaline Phosphatase: 67 U/L (ref 38–126)
Anion gap: 10 (ref 5–15)
BUN: 14 mg/dL (ref 6–20)
CO2: 26 mmol/L (ref 22–32)
Calcium: 9.4 mg/dL (ref 8.9–10.3)
Chloride: 103 mmol/L (ref 98–111)
Creatinine: 0.56 mg/dL (ref 0.44–1.00)
GFR, Estimated: >60 mL/min
Glucose, Bld: 107 mg/dL — ABNORMAL HIGH (ref 70–99)
Potassium: 3.8 mmol/L (ref 3.5–5.1)
Sodium: 139 mmol/L (ref 135–145)
Total Bilirubin: 0.4 mg/dL (ref 0.0–1.2)
Total Protein: 7.1 g/dL (ref 6.5–8.1)

## 2024-02-26 LAB — MAGNESIUM: Magnesium: 2.2 mg/dL (ref 1.7–2.4)

## 2024-02-26 NOTE — Telephone Encounter (Signed)
 Faxed lab results to [facility] with confirmation at [phone].

## 2024-02-28 ENCOUNTER — Telehealth: Payer: Self-pay | Admitting: Physician Assistant

## 2024-02-28 ENCOUNTER — Inpatient Hospital Stay

## 2024-02-28 ENCOUNTER — Telehealth: Payer: Self-pay | Admitting: Medical Oncology

## 2024-02-28 DIAGNOSIS — C9200 Acute myeloblastic leukemia, not having achieved remission: Secondary | ICD-10-CM

## 2024-02-28 LAB — CBC WITH DIFFERENTIAL (CANCER CENTER ONLY)
Abs Immature Granulocytes: 0.01 10*3/uL (ref 0.00–0.07)
Basophils Absolute: 0 10*3/uL (ref 0.0–0.1)
Basophils Relative: 0 %
Eosinophils Absolute: 0 10*3/uL (ref 0.0–0.5)
Eosinophils Relative: 0 %
HCT: 27.7 % — ABNORMAL LOW (ref 36.0–46.0)
Hemoglobin: 9.6 g/dL — ABNORMAL LOW (ref 12.0–15.0)
Immature Granulocytes: 1 %
Lymphocytes Relative: 29 %
Lymphs Abs: 0.5 10*3/uL — ABNORMAL LOW (ref 0.7–4.0)
MCH: 32.1 pg (ref 26.0–34.0)
MCHC: 34.7 g/dL (ref 30.0–36.0)
MCV: 92.6 fL (ref 80.0–100.0)
Monocytes Absolute: 0.4 10*3/uL (ref 0.1–1.0)
Monocytes Relative: 25 %
Neutro Abs: 0.8 10*3/uL — ABNORMAL LOW (ref 1.7–7.7)
Neutrophils Relative %: 45 %
Platelet Count: 122 10*3/uL — ABNORMAL LOW (ref 150–400)
RBC: 2.99 MIL/uL — ABNORMAL LOW (ref 3.87–5.11)
RDW: 17 % — ABNORMAL HIGH (ref 11.5–15.5)
WBC Count: 1.7 10*3/uL — ABNORMAL LOW (ref 4.0–10.5)
nRBC: 0 % (ref 0.0–0.2)

## 2024-02-28 LAB — SAMPLE TO BLOOD BANK

## 2024-02-28 LAB — CMP (CANCER CENTER ONLY)
ALT: 20 U/L (ref 0–44)
AST: 22 U/L (ref 15–41)
Albumin: 4.2 g/dL (ref 3.5–5.0)
Alkaline Phosphatase: 66 U/L (ref 38–126)
Anion gap: 9 (ref 5–15)
BUN: 13 mg/dL (ref 6–20)
CO2: 27 mmol/L (ref 22–32)
Calcium: 9.4 mg/dL (ref 8.9–10.3)
Chloride: 105 mmol/L (ref 98–111)
Creatinine: 0.56 mg/dL (ref 0.44–1.00)
GFR, Estimated: >60 mL/min
Glucose, Bld: 101 mg/dL — ABNORMAL HIGH (ref 70–99)
Potassium: 3.9 mmol/L (ref 3.5–5.1)
Sodium: 141 mmol/L (ref 135–145)
Total Bilirubin: 0.3 mg/dL (ref 0.0–1.2)
Total Protein: 6.7 g/dL (ref 6.5–8.1)

## 2024-02-28 LAB — MAGNESIUM: Magnesium: 2 mg/dL (ref 1.7–2.4)

## 2024-02-28 NOTE — Telephone Encounter (Signed)
 CRITICAL VALUE STICKER  CRITICAL VALUE: ANC=0.8  RECEIVER :Nichlas Pitera, RN  looked in chart and got results   DATE & TIME NOTIFIED: 02/28/2023 @ 0903   MD NOTIFIED: Sherrod and faxed to [facility] RN , Rosaline.  TIME OF NOTIFICATION: 0915  RESPONSE:  pending.       LVM for pt to return my call re lab results. Critical and all lab results called to pt and faxed to [facility] successfully with receipt confirmation.

## 2024-02-28 NOTE — Telephone Encounter (Signed)
 I called the patient to let her know she does not need to come in for her blood infusion appointment.  However, she is neutropenic.  Her labs were faxed to [facility].  No action required at this time but they recommended continuing the lab check on 03/03/2024.  I did review neutropenic precautions with the patient.  She expressed understanding with the instructions.

## 2024-02-28 NOTE — Telephone Encounter (Signed)
 Christy from [facility] said they received and reviewed pt labs drawn today at [facility]. No further action except to draw pts labs again on 02/9.

## 2024-02-29 ENCOUNTER — Inpatient Hospital Stay

## 2024-03-03 ENCOUNTER — Inpatient Hospital Stay

## 2024-03-06 ENCOUNTER — Inpatient Hospital Stay

## 2024-03-07 ENCOUNTER — Inpatient Hospital Stay

## 2024-03-10 ENCOUNTER — Inpatient Hospital Stay

## 2024-03-13 ENCOUNTER — Inpatient Hospital Stay

## 2024-03-14 ENCOUNTER — Inpatient Hospital Stay

## 2024-03-20 ENCOUNTER — Inpatient Hospital Stay: Admitting: Internal Medicine

## 2024-03-20 ENCOUNTER — Inpatient Hospital Stay
